# Patient Record
Sex: Female | Born: 1990
Health system: Southern US, Community
[De-identification: ages and names within clinical notes are randomized; demographics above are authoritative.]

## PROBLEM LIST (undated history)

## (undated) DIAGNOSIS — T7840XA Allergy, unspecified, initial encounter: Secondary | ICD-10-CM

## (undated) DIAGNOSIS — N12 Tubulo-interstitial nephritis, not specified as acute or chronic: Secondary | ICD-10-CM

## (undated) DIAGNOSIS — O99345 Other mental disorders complicating the puerperium: Secondary | ICD-10-CM

## (undated) DIAGNOSIS — F53 Postpartum depression: Secondary | ICD-10-CM

## (undated) HISTORY — DX: Allergy, unspecified, initial encounter: T78.40XA

## (undated) HISTORY — PX: OTHER SURGICAL HISTORY: SHX169

## (undated) HISTORY — PX: WISDOM TOOTH EXTRACTION: SHX21

## (undated) HISTORY — PX: APPENDECTOMY: SHX54

## (undated) HISTORY — DX: Other mental disorders complicating the puerperium: O99.345

## (undated) HISTORY — DX: Postpartum depression: F53.0

## (undated) HISTORY — PX: FRACTURE SURGERY: SHX138

## (undated) HISTORY — DX: Tubulo-interstitial nephritis, not specified as acute or chronic: N12

---

## 2014-12-07 ENCOUNTER — Encounter: Payer: Self-pay | Admitting: Pediatrics

## 2014-12-07 ENCOUNTER — Ambulatory Visit (INDEPENDENT_AMBULATORY_CARE_PROVIDER_SITE_OTHER): Payer: Medicaid Other | Admitting: Pediatrics

## 2014-12-07 VITALS — BP 124/70 | HR 67 | Temp 97.6°F | Ht 66.0 in | Wt 232.8 lb

## 2014-12-07 DIAGNOSIS — Z6837 Body mass index (BMI) 37.0-37.9, adult: Secondary | ICD-10-CM | POA: Diagnosis not present

## 2014-12-07 DIAGNOSIS — Z6833 Body mass index (BMI) 33.0-33.9, adult: Secondary | ICD-10-CM | POA: Insufficient documentation

## 2014-12-07 DIAGNOSIS — M25532 Pain in left wrist: Secondary | ICD-10-CM | POA: Diagnosis not present

## 2014-12-07 DIAGNOSIS — Z23 Encounter for immunization: Secondary | ICD-10-CM

## 2014-12-07 NOTE — Progress Notes (Signed)
    Subjective:    Patient ID: Misty Wright, female    DOB: Dec 13, 1990, 24 y.o.   MRN: [id number]  CC: wrist pain  HPI: Misty Wright is a 24 y.o. female presenting on 12/07/2014 for New Patient (Initial Visit) and Wrist Pain  Grip and twist and grip and lift painful, L wrist Pt R handed Crossfit started recently, beginning of the month did 6 classes, some burpees during classes, does not remember ever hurting wrist during class, would twinge sometimes No injury to wrist, started hurting within last three days Generally fairly healthy  IUD--has a copper one, placed  One and 26 yo girls at home, [place] In school online [employer] Last pap smear is 2015. No STIs Lives with husband, girls  Relevant past medical, surgical, family and social history reviewed and updated as indicated. Interim medical history since our last visit reviewed. Allergies and medications reviewed and updated.  ROS: All systems negative other than what is in HPI  Past Medical History Patient Active Problem List   Diagnosis Date Noted  . BMI 37.0-37.9, adult 12/07/2014    No current outpatient prescriptions on file.   No current facility-administered medications for this visit.       Objective:    BP 124/70 mmHg  Pulse 67  Temp(Src) 97.6 F (36.4 C) (Oral)  Ht 5\' 6"  (1.676 m)  Wt 232 lb 12.8 oz (105.597 kg)  BMI 37.59 kg/m2  LMP 12/03/2014  Wt Readings from Last 3 Encounters:  12/07/14 232 lb 12.8 oz (105.597 kg)    Gen: NAD, alert, cooperative with exam, NCAT EYES: EOMI, no scleral injection or icterus ENT:  TMs pearly gray b/l, OP without erythema LYMPH: no cervical LAD CV: NRRR, normal S1/S2, no murmur, distal pulses 2+ b/l Resp: CTABL, no wheezes, normal WOB Abd: +BS, soft, NTND. no guarding or organomegaly Ext: No edema, warm Neuro: Alert and oriented, strength equal b/l UE and LE, coordination grossly normal MSK: Wrist: Inspection normal with no visible erythema or  swelling. ROM smooth and slightly decreased extension and ulnar/radial deviation compared with opposite wrist.  Positive Finkelstein      Assessment & Plan:    Misty Wright was seen today for new patient (initial visit) and wrist pain.  Diagnoses and all orders for this visit:  BMI 37.0-37.9, adult Gained 90 lbs with birth of first child, has not been able to lose it since. Has been emotional problem Already following [employer], trying to keep healthy snacks around. Not drinking sugary drinks. Does sometimes eat when stressed. Goals: Walk or other activity 5 days a week Take a break when getting stressed, go for walk, go play outside with kids  Left wrist pain No known injury, slightly swollen, likely De Quevrains tenosynovitis given positive Finkelstein's, pain with tu Rest, NSAIDs Wrist splint if helps Let me know if not improving   Follow up plan: Return in about 3 months (around 03/09/2015).  Assunta Found, MD Misty Wright 12/07/2014, 9:15 AM

## 2014-12-07 NOTE — Patient Instructions (Addendum)
Walk or other activity 5 days a week Take a break when getting stressed, go for walk, go play outside with kids Keep up the good work!!!!

## 2014-12-11 ENCOUNTER — Ambulatory Visit (INDEPENDENT_AMBULATORY_CARE_PROVIDER_SITE_OTHER): Payer: Medicaid Other

## 2014-12-11 ENCOUNTER — Other Ambulatory Visit: Payer: Medicaid Other

## 2014-12-11 ENCOUNTER — Telehealth: Payer: Self-pay | Admitting: Pediatrics

## 2014-12-11 DIAGNOSIS — M25531 Pain in right wrist: Secondary | ICD-10-CM | POA: Diagnosis not present

## 2014-12-11 NOTE — Telephone Encounter (Signed)
Ordered wrist film, she will be here later today to get that done.

## 2014-12-12 NOTE — Telephone Encounter (Signed)
Pt came for x-ray

## 2015-03-09 ENCOUNTER — Ambulatory Visit (INDEPENDENT_AMBULATORY_CARE_PROVIDER_SITE_OTHER): Payer: Medicaid Other | Admitting: Pediatrics

## 2015-03-09 ENCOUNTER — Encounter: Payer: Self-pay | Admitting: Pediatrics

## 2015-03-09 VITALS — BP 107/69 | HR 76 | Temp 97.9°F | Ht 66.0 in | Wt 206.2 lb

## 2015-03-09 DIAGNOSIS — Z6833 Body mass index (BMI) 33.0-33.9, adult: Secondary | ICD-10-CM

## 2015-03-09 NOTE — Progress Notes (Signed)
Subjective:    Patient ID: Misty Wright, female    DOB: 11/05/1990, 24 y.o.   MRN: [id number]  CC: Follow-up BMI and wrist pain  HPI: Misty Wright is a 24 y.o. female presenting for Follow-up  Here for f/u wrist pain and weight Stopped breast feeding, was gaining weight regularly while breastfeeding she thinks Working out at home, walking some [employer]  Lots of fruits and vegetables Still in school, less stressful semester Overall doing well Wrist no longer hurting  When in good shape weight was 155 lb   Depression screen PHQ 2/9 03/09/2015 12/07/2014  Decreased Interest 1 0  Down, Depressed, Hopeless 1 1  PHQ - 2 Score 2 1  Altered sleeping 0 -  Tired, decreased energy 1 -  Change in appetite 1 -  Feeling bad or failure about yourself  1 -  Trouble concentrating 1 -  Moving slowly or fidgety/restless 0 -  Suicidal thoughts 0 -  PHQ-9 Score 6 -  Difficult doing work/chores Somewhat difficult -     Relevant past medical, surgical, family and social history reviewed and updated as indicated. Interim medical history since our last visit reviewed. Allergies and medications reviewed and updated.    ROS: Per HPI unless specifically indicated above  History  Smoking status  . Former Smoker  . Types: Cigarettes  . Quit date: 12/24/2012  Smokeless tobacco  . Not on file    Past Medical History Patient Active Problem List   Diagnosis Date Noted  . BMI 33.0-33.9,adult 12/07/2014    No current outpatient prescriptions on file.   No current facility-administered medications for this visit.       Objective:    BP 107/69 mmHg  Pulse 76  Temp(Src) 97.9 F (36.6 C) (Oral)  Ht 5' 6" (1.676 m)  Wt 206 lb 3.2 oz (93.532 kg)  BMI 33.30 kg/m2  Wt Readings from Last 3 Encounters:  03/09/15 206 lb 3.2 oz (93.532 kg)  12/07/14 232 lb 12.8 oz (105.597 kg)     Gen: NAD, alert, cooperative with exam, NCAT EYES: EOMI, no scleral injection or  icterus ENT:  MMM LYMPH: no cervical LAD CV: NRRR, normal S1/S2, no murmur, distal pulses 2+ b/l Resp: CTABL, no wheezes, normal WOB Ext: No edema, warm Neuro: Alert and oriented, strength equal b/l UE and LE, coordination grossly normal MSK: normal muscle bulk, normal ROM of wrists b/l, no point tenderness     Assessment & Plan:    Misty Wright was seen today for follow-up BMI. Has lost 30 lbs over past three months with increase in exercise, decrease in calorie intake. Is pleased with her progress. Does not feel deprived of food, is eating three meals every day. Will check BMP today, f/u in 6 months. Continue lifestyle changes.  Diagnoses and all orders for this visit:  BMI 33.0-33.9,adult -     BMP8+EGFR    Follow up plan: Return in about 6 months (around 09/06/2015).   , MD [facility] 03/09/2015, 8:34 AM   

## 2015-03-09 NOTE — Patient Instructions (Signed)
Keep up the good work

## 2015-03-22 ENCOUNTER — Encounter: Payer: Self-pay | Admitting: Pediatrics

## 2015-03-22 ENCOUNTER — Ambulatory Visit (INDEPENDENT_AMBULATORY_CARE_PROVIDER_SITE_OTHER): Payer: Medicaid Other | Admitting: Pediatrics

## 2015-03-22 VITALS — BP 102/66 | HR 84 | Temp 97.2°F | Ht 66.0 in | Wt 201.8 lb

## 2015-03-22 DIAGNOSIS — L600 Ingrowing nail: Secondary | ICD-10-CM

## 2015-03-22 MED ORDER — CEPHALEXIN 500 MG PO CAPS: 500.0000 mg | ORAL_CAPSULE | Freq: Two times a day (BID) | ORAL | Status: DC

## 2015-03-22 NOTE — Progress Notes (Signed)
Subjective:    Patient ID: Misty Wright, female    DOB: 1990/04/21, 25 y.o.   MRN: [id number]  CC: Toe Pain   HPI: Misty Wright is a 25 y.o. female presenting for Toe Pain  Has had problems with R great toenail starting about a year ago, had broken nail short, nail had grown out and pain was better but then broke nail short again recently Pt has had ingrown toenails on L great toe removed both medially and laterally without return of pain and would like same done on R foot.  No fevers, some redness, pain in toe anytime something hits it. No discharge.  Depression screen [facility] 2/9 03/22/2015 03/09/2015 12/07/2014  Decreased Interest 0 1 0  Down, Depressed, Hopeless 0 1 1  PHQ - 2 Score 0 2 1  Altered sleeping - 0 -  Tired, decreased energy - 1 -  Change in appetite - 1 -  Feeling bad or failure about yourself  - 1 -  Trouble concentrating - 1 -  Moving slowly or fidgety/restless - 0 -  Suicidal thoughts - 0 -  PHQ-9 Score - 6 -  Difficult doing work/chores - Somewhat difficult -     Relevant past medical, surgical, family and social history reviewed and updated as indicated. Interim medical history since our last visit reviewed. Allergies and medications reviewed and updated.    ROS: Per HPI unless specifically indicated above  History  Smoking status  . Former Smoker  . Types: Cigarettes  . Quit date: 12/24/2012  Smokeless tobacco  . Not on file    Past Medical History Patient Active Problem List   Diagnosis Date Noted  . BMI 33.0-33.9,adult 12/07/2014    Current Outpatient Prescriptions  Medication Sig Dispense Refill  . cephALEXin (KEFLEX) 500 MG capsule Take 1 capsule (500 mg total) by mouth 2 (two) times daily. 14 capsule 0   No current facility-administered medications for this visit.       Objective:    BP 102/66 mmHg  Pulse 84  Temp(Src) 97.2 F (36.2 C) (Oral)  Ht 5\' 6"  (1.676 m)  Wt 201 lb 12.8 oz (91.536 kg)  BMI 32.59 kg/m2  Wt  Readings from Last 3 Encounters:  03/22/15 201 lb 12.8 oz (91.536 kg)  03/09/15 206 lb 3.2 oz (93.532 kg)  12/07/14 232 lb 12.8 oz (105.597 kg)     Gen: NAD, alert, cooperative with exam, NCAT EYES: EOMI, no scleral injection or icterus CV: distal pulses 2+ b/l, WWP Resp:  normal WOB Ext: No edema, warm Neuro: Alert and oriented Skin: R great toe with nail broken off to apprx halfway back toward nail bed at an angle, full nail present Lateral side of nail. Some redness, some tenderness with palpation along nail bed.      Assessment & Plan:    Diann was seen today for toe pain.  Diagnoses and all orders for this visit:  Ingrown right big toenail Discussed options including waiting for nail to grow out, trial of abx, and removal of nail, Can let nail regrow to see if problem resolves vs ablating nail. Pt would like medial side of nail where she has been having pain removed with nail bed ablated. See separate procedure note.  Other orders -     cephALEXin (KEFLEX) 500 MG capsule; Take 1 capsule (500 mg total) by mouth 2 (two) times daily.   PROCEDURE NOTE:  Nail surgery. Treatment options and alternatives discussed. Recommended permanent phenol matrixectomy  and patient agreed. Right hallux was prepped with betadine scrub and a toe block of 6cc of 1% lidocaine plain was administered in a digital toe block. The toe was then prepped with betadine solution . The offending nail border was then excised and matrix tissue exposed. Phenol was then applied to the matrix tissue followed by an betadine wash. Antibiotic ointment and a dry sterile dressing was applied. The patient was dispensed instructions for aftercare. Home instructions given.Rx for cephalexin for 7 days given to prevent infection given redness in area prior to procedure. Pt tolerated procedure well.     Follow up plan: prn  Assunta Found, MD [facility] 03/22/2015, 1:05 PM

## 2015-04-03 ENCOUNTER — Telehealth: Payer: Self-pay | Admitting: Pediatrics

## 2015-04-03 MED ORDER — FLUCONAZOLE 150 MG PO TABS: ORAL_TABLET | ORAL | Status: DC

## 2015-04-03 NOTE — Telephone Encounter (Signed)
Stp and she states she has vaginal itching and white thick discharge after taking the antibiotic. Diflucan 150 #2 take 1 now and repeat in 3 days sent over to pharmacy.

## 2015-07-02 ENCOUNTER — Telehealth: Payer: Self-pay | Admitting: Pediatrics

## 2015-07-02 NOTE — Telephone Encounter (Signed)
Spoke with pt and she had questions regarding her Paragard IUD. Pt has had some irregular menses and spotting in between menses. Advised pt to take urine pregnancy test and feel for strings, if she can feel the strings and the urine pregnancy test are negative to monitor for a few months and make an appt for evaluation if it continues. If the urine pregnancy test is positive or she can't feel the strings advised to CB and schedule appt. Pt voiced understanding.

## 2015-07-19 ENCOUNTER — Ambulatory Visit (INDEPENDENT_AMBULATORY_CARE_PROVIDER_SITE_OTHER): Payer: Medicaid Other | Admitting: Family Medicine

## 2015-07-19 ENCOUNTER — Encounter: Payer: Self-pay | Admitting: Family Medicine

## 2015-07-19 VITALS — BP 114/74 | HR 81 | Temp 97.4°F | Ht 66.0 in | Wt 202.0 lb

## 2015-07-19 DIAGNOSIS — Z029 Encounter for administrative examinations, unspecified: Secondary | ICD-10-CM | POA: Diagnosis not present

## 2015-07-19 NOTE — Progress Notes (Signed)
   HPI  Patient presents today here to have school forms filled out.  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  She has shot records present with her, this is a state shot card which is filled out in detail. After reviewing her paperwork she does not need a TB test and she is not lacking any immunizations.   Her paperwork was signed agreeing that all was accurate.    Laroy Apple, MD [facility] 07/19/2015, 2:42 PM

## 2015-08-14 ENCOUNTER — Telehealth: Payer: Self-pay

## 2015-08-14 DIAGNOSIS — L600 Ingrowing nail: Secondary | ICD-10-CM | POA: Insufficient documentation

## 2015-08-14 NOTE — Telephone Encounter (Signed)
Wants to be referred to Podiatrist that takes MCD for ingrown toenail

## 2015-08-14 NOTE — Telephone Encounter (Signed)
That is fine, ok with referral.   We can take care of it also, the wait may be longer than she prefers for podiatry.   Laroy Apple, MD [facility] 08/14/2015, 11:25 AM

## 2015-08-31 ENCOUNTER — Encounter: Payer: Self-pay | Admitting: Podiatry

## 2015-08-31 ENCOUNTER — Ambulatory Visit (INDEPENDENT_AMBULATORY_CARE_PROVIDER_SITE_OTHER): Payer: Medicaid Other | Admitting: Podiatry

## 2015-08-31 VITALS — BP 104/62 | HR 65 | Resp 16 | Ht 66.0 in | Wt 178.0 lb

## 2015-08-31 DIAGNOSIS — L600 Ingrowing nail: Secondary | ICD-10-CM | POA: Diagnosis not present

## 2015-08-31 NOTE — Patient Instructions (Signed)

## 2015-08-31 NOTE — Progress Notes (Signed)
   Subjective:    Patient ID: Misty Wright, female    DOB: 08/25/1990, 25 y.o.   MRN: [id number]  HPI Chief Complaint  Patient presents with  . Nail Problem    Right foot; great toe-medial; pt stated, "Saw pus coming out of toe last week; used epsom salt and neosporin with relief"    Pt was treated by her Primary Care Doctor 6 months ago for her ingrown toenail on her right foot, great toe.    Review of Systems  Constitutional: Positive for unexpected weight change.  All other systems reviewed and are negative.      Objective:   Physical Exam        Assessment & Plan:

## 2015-09-02 NOTE — Progress Notes (Signed)
Subjective:     Patient ID: Misty Wright, female   DOB: October 30, 1990, 25 y.o.   MRN: [id number]  HPI patient presents stating she's had an ingrown toenail the right big toe and she had 1 on the left one that was fixed and she wants this fixed permanently   Review of Systems  All other systems reviewed and are negative.      Objective:   Physical Exam  Constitutional: She is oriented to person, place, and time.  Cardiovascular: Intact distal pulses.   Musculoskeletal: Normal range of motion.  Neurological: She is oriented to person, place, and time.  Skin: Skin is warm.  Nursing note and vitals reviewed.  neurovascular status found to be intact muscle strength adequate range of motion within normal limits with patient noted to have an incurvated medial border right hallux that's painful when pressed and makes shoe gear difficult with distal redness but no drainage. Patient is noted to have incurvation that's painful when palpated     Assessment:     Ingrown toenail deformity right hallux medial border    Plan:     Instructed on soaks and I went ahead today infiltrated the right hallux 60 mg I can Marcaine mixture first discussing risk of procedure and then remove the medial border exposing matrix and applying phenol 3 applications 30 seconds followed by alcohol lavage and sterile dressing

## 2015-09-06 ENCOUNTER — Ambulatory Visit: Payer: Medicaid Other | Admitting: Pediatrics

## 2015-09-06 ENCOUNTER — Ambulatory Visit: Payer: Medicaid Other | Admitting: Podiatry

## 2015-09-25 ENCOUNTER — Telehealth: Payer: Self-pay | Admitting: *Deleted

## 2015-09-25 NOTE — Telephone Encounter (Signed)
Called patient at [phone] (Home #) to check to see how they were doing from their ingrown toenail procedure that was performed on Friday, August 31, 2015. Pt stated, "Everything seems to be doing well and has no pain".

## 2016-03-07 ENCOUNTER — Other Ambulatory Visit: Payer: Medicaid Other | Admitting: Pediatrics

## 2016-09-01 ENCOUNTER — Encounter: Payer: Self-pay | Admitting: Family Medicine

## 2016-09-01 ENCOUNTER — Other Ambulatory Visit (HOSPITAL_COMMUNITY)
Admission: RE | Admit: 2016-09-01 | Discharge: 2016-09-01 | Disposition: A | Discharge status: Home / Self Care | Payer: BLUE CROSS/BLUE SHIELD | Source: Ambulatory Visit | Attending: Family Medicine | Admitting: Family Medicine

## 2016-09-01 ENCOUNTER — Ambulatory Visit (INDEPENDENT_AMBULATORY_CARE_PROVIDER_SITE_OTHER): Payer: BLUE CROSS/BLUE SHIELD | Admitting: Family Medicine

## 2016-09-01 VITALS — BP 110/72 | HR 72 | Ht 66.0 in | Wt 184.6 lb

## 2016-09-01 DIAGNOSIS — Z124 Encounter for screening for malignant neoplasm of cervix: Secondary | ICD-10-CM

## 2016-09-01 DIAGNOSIS — Z0000 Encounter for general adult medical examination without abnormal findings: Secondary | ICD-10-CM | POA: Diagnosis not present

## 2016-09-01 DIAGNOSIS — Z01419 Encounter for gynecological examination (general) (routine) without abnormal findings: Secondary | ICD-10-CM | POA: Diagnosis not present

## 2016-09-01 LAB — CBC WITH DIFFERENTIAL/PLATELET
Basophils Absolute: 0 cells/uL (ref 0–200)
Basophils Relative: 0 %
EOS PCT: 1 %
Eosinophils Absolute: 68 cells/uL (ref 15–500)
HCT: 42 % (ref 35.0–45.0)
HEMOGLOBIN: 14 g/dL (ref 11.7–15.5)
LYMPHS ABS: 1904 {cells}/uL (ref 850–3900)
Lymphocytes Relative: 28 %
MCH: 29.2 pg (ref 27.0–33.0)
MCHC: 33.3 g/dL (ref 32.0–36.0)
MCV: 87.7 fL (ref 80.0–100.0)
MPV: 9.2 fL (ref 7.5–12.5)
Monocytes Absolute: 340 cells/uL (ref 200–950)
Monocytes Relative: 5 %
NEUTROS PCT: 66 %
Neutro Abs: 4488 cells/uL (ref 1500–7800)
Platelets: 256 10*3/uL (ref 140–400)
RBC: 4.79 MIL/uL (ref 3.80–5.10)
RDW: 13.3 % (ref 11.0–15.0)
WBC: 6.8 10*3/uL (ref 4.0–10.5)

## 2016-09-01 LAB — POCT URINALYSIS DIP (PROADVANTAGE DEVICE)
BILIRUBIN UA: NEGATIVE
BILIRUBIN UA: NEGATIVE mg/dL
Blood, UA: NEGATIVE
Glucose, UA: NEGATIVE mg/dL
LEUKOCYTES UA: NEGATIVE
Nitrite, UA: NEGATIVE
Protein Ur, POC: NEGATIVE mg/dL
Specific Gravity, Urine: 1.02
Urobilinogen, Ur: NEGATIVE
pH, UA: 6 (ref 5.0–8.0)

## 2016-09-01 LAB — COMPREHENSIVE METABOLIC PANEL
ALBUMIN: 4.5 g/dL (ref 3.6–5.1)
ALT: 14 U/L (ref 6–29)
AST: 15 U/L (ref 10–30)
Alkaline Phosphatase: 44 U/L (ref 33–115)
BILIRUBIN TOTAL: 0.5 mg/dL (ref 0.2–1.2)
BUN: 10 mg/dL (ref 7–25)
CHLORIDE: 103 mmol/L (ref 98–110)
CO2: 24 mmol/L (ref 20–31)
Calcium: 9.1 mg/dL (ref 8.6–10.2)
Creat: 0.69 mg/dL (ref 0.50–1.10)
GLUCOSE: 83 mg/dL (ref 65–99)
Potassium: 4.1 mmol/L (ref 3.5–5.3)
Sodium: 139 mmol/L (ref 135–146)
Total Protein: 7.2 g/dL (ref 6.1–8.1)

## 2016-09-01 NOTE — Patient Instructions (Signed)

## 2016-09-01 NOTE — Progress Notes (Signed)
Subjective:    Patient ID: Misty Wright, female    DOB: 22-Sep-1990, 26 y.o.   MRN: [id number]  HPI Chief Complaint  Patient presents with  . new pt    new pt, no other concens. non fasting. due for pap smear   She is new to the practice and here for a complete physical exam. Previous medical care: [facility].  Last CPE: 1 year ago.   Other providers: none currently.   Past medical history: postpartum depression and took medication for 4-6 months after the birth of her first child. No meds since 2013. Had counseling then but nothing since. Is not interested in counseling now.   Social history: Lives with 2 daughters age 54 and 6 and husband.  works as Agricultural engineer. Just graduated from [employer] with masters degree.  Smoked 2 cigarettes per day for 4 years and quit in 2013. drinks alcohol 1-2 per week, denies drug use  Diet: fairly healthy. Likes sweets.  Excerise: 3 times per week.   Immunizations: Tdap- 3 years ago.   Health maintenance:  Mammogram: N/A Colonoscopy: N/A Last pap smear: 20015 Last Menstrual cycle: 08/05/2016 Contraception: IUD  Pregnancies: 3 Last Dental Exam: 1 year ago.  Last Eye Exam: years ago.   Depression screen [facility] 2/9 09/01/2016 07/19/2015 03/22/2015 03/09/2015 12/07/2014  Decreased Interest 0 0 0 1 0  Down, Depressed, Hopeless 0 0 0 1 1  PHQ - 2 Score 0 0 0 2 1  Altered sleeping - - - 0 -  Tired, decreased energy - - - 1 -  Change in appetite - - - 1 -  Feeling bad or failure about yourself  - - - 1 -  Trouble concentrating - - - 1 -  Moving slowly or fidgety/restless - - - 0 -  Suicidal thoughts - - - 0 -  PHQ-9 Score - - - 6 -  Difficult doing work/chores - - - Somewhat difficult -     Wears seatbelt always, uses sunscreen, smoke detectors in home and functioning, does not text while driving and feels safe in home environment.   Reviewed allergies, medications, past medical, surgical, family, and social  history.   Review of Systems Review of Systems Constitutional: -fever, -chills, -sweats, -unexpected weight change,-fatigue ENT: -runny nose, -ear pain, -sore throat Cardiology:  -chest pain, -palpitations, -edema Respiratory: -cough, -shortness of breath, -wheezing Gastroenterology: -abdominal pain, -nausea, -vomiting, -diarrhea, -constipation  Hematology: -bleeding or bruising problems Musculoskeletal: -arthralgias, -myalgias, -joint swelling, -back pain Ophthalmology: -vision changes Urology: -dysuria, -difficulty urinating, -hematuria, -urinary frequency, -urgency Neurology: -headache, -weakness, -tingling, -numbness       Objective:   Physical Exam BP 110/72   Pulse 72   Ht 5\' 6"  (1.676 m)   Wt 184 lb 9.6 oz (83.7 kg)   LMP 08/05/2016   BMI 29.80 kg/m   General Appearance:    Alert, cooperative, no distress, appears stated age  Head:    Normocephalic, without obvious abnormality, atraumatic  Eyes:    PERRL, conjunctiva/corneas clear, EOM's intact, fundi    benign  Ears:    Normal TM's and external ear canals  Nose:   Nares normal, mucosa normal, no drainage or sinus   tenderness  Throat:   Lips, mucosa, and tongue normal; teeth and gums normal  Neck:   Supple, no lymphadenopathy;  thyroid:  no   enlargement/tenderness/nodules; no carotid   bruit or JVD  Back:    Spine nontender, no curvature, ROM normal, no  CVA     tenderness  Lungs:     Clear to auscultation bilaterally without wheezes, rales or     ronchi; respirations unlabored  Chest Wall:    No tenderness or deformity   Heart:    Regular rate and rhythm, S1 and S2 normal, no murmur, rub   or gallop  Breast Exam:    No tenderness, masses, or nipple discharge or inversion.      No axillary lymphadenopathy  Abdomen:     Soft, non-tender, nondistended, normoactive bowel sounds,    no masses, no hepatosplenomegaly  Genitalia:    Normal external genitalia without lesions.  BUS and vagina normal; cervix without  lesions, or cervical motion tenderness. No abnormal vaginal discharge.  Uterus and adnexa not enlarged, nontender, no masses.  Pap performed and IUD strings visualized. Chaperone present.   Rectal:    Not performed due to age<40 and no related complaints  Extremities:   No clubbing, cyanosis or edema  Pulses:   2+ and symmetric all extremities  Skin:   Skin color, texture, turgor normal, no rashes or lesions  Lymph nodes:   Cervical, supraclavicular, and axillary nodes normal  Neurologic:   CNII-XII intact, normal strength, sensation and gait; reflexes 2+ and symmetric throughout          Psych:   Normal mood, affect, hygiene and grooming.     Urinalysis dipstick: neg       Assessment & Plan:  Routine general medical examination at a health care facility - Plan: CBC with Differential/Platelet, Comprehensive metabolic panel, POCT Urinalysis DIP (Proadvantage Device)  Screening for cervical cancer - Plan: Cytology - PAP  She is doing well overall physically and emotionally. No depression currently.  Immunization history at [place], will get these records. She appears to be up to date.  Pap smear done- chaperone present.  Counseled on safety and health promotion.  Declines STD testing.  Will get records from her previous PCP.  Follow up pending labs.

## 2016-09-03 LAB — CYTOLOGY - PAP: DIAGNOSIS: NEGATIVE

## 2016-09-04 ENCOUNTER — Encounter: Payer: Self-pay | Admitting: Internal Medicine

## 2016-09-04 ENCOUNTER — Telehealth: Payer: Self-pay

## 2016-09-04 NOTE — Telephone Encounter (Signed)
Shot records from [employer] placed in your folder for review. Misty Wright

## 2016-11-25 DIAGNOSIS — F418 Other specified anxiety disorders: Secondary | ICD-10-CM | POA: Diagnosis not present

## 2016-11-25 DIAGNOSIS — F331 Major depressive disorder, recurrent, moderate: Secondary | ICD-10-CM | POA: Diagnosis not present

## 2016-12-03 DIAGNOSIS — F418 Other specified anxiety disorders: Secondary | ICD-10-CM | POA: Diagnosis not present

## 2016-12-11 DIAGNOSIS — F418 Other specified anxiety disorders: Secondary | ICD-10-CM | POA: Diagnosis not present

## 2016-12-13 DIAGNOSIS — Z23 Encounter for immunization: Secondary | ICD-10-CM | POA: Diagnosis not present

## 2017-10-01 ENCOUNTER — Ambulatory Visit: Payer: BLUE CROSS/BLUE SHIELD | Admitting: Family Medicine

## 2017-10-01 ENCOUNTER — Encounter: Payer: Self-pay | Admitting: Family Medicine

## 2017-10-01 VITALS — BP 122/80 | HR 70 | Ht 66.0 in | Wt 179.2 lb

## 2017-10-01 DIAGNOSIS — Z0000 Encounter for general adult medical examination without abnormal findings: Secondary | ICD-10-CM

## 2017-10-01 NOTE — Patient Instructions (Signed)

## 2017-10-01 NOTE — Addendum Note (Signed)
Addended by: Minette Headland A on: 10/01/2017 03:53 PM   Modules accepted: Orders

## 2017-10-01 NOTE — Progress Notes (Signed)
Subjective:    Patient ID: Misty Wright, female    DOB: 08/28/1990, 27 y.o.   MRN: [id number]  HPI Chief Complaint  Patient presents with  . cpe    cpe, right big toe callous. no other concerns   She is here for a complete physical exam. Previous medical care: Last CPE: 09/01/2016   Has seen a counselor in the past year for depressed mood. States she is no longer seeing someone due to cost. Mood is ok, fluctuates and has good days and bad days.   Has a callus on her right great toe. Not bothersome. Soaks this and shaves it down occasionally.   Social history: Lives with her husband who works in Insurance underwriter and her 2 daughters, works as Agricultural engineer Denies smoking, drinking alcohol-1 glass of wine most nights , denies drug use  Diet: healthy  Excerise: usually. Training for bike ride  Caffeine: 1 - 2 cups per day   Immunizations: UTD  Health maintenance:  Mammogram: N/A Colonoscopy: N/A Last Gynecological Exam: last year  Last Menstrual cycle: last week and regular  IUD in place  Last Dental Exam: last month  Last Eye Exam: years ago   Depression screen [facility] 2/9 10/01/2017 09/01/2016 07/19/2015 03/22/2015 03/09/2015  Decreased Interest 0 0 0 0 1  Down, Depressed, Hopeless 0 0 0 0 1  PHQ - 2 Score 0 0 0 0 2  Altered sleeping - - - - 0  Tired, decreased energy - - - - 1  Change in appetite - - - - 1  Feeling bad or failure about yourself  - - - - 1  Trouble concentrating - - - - 1  Moving slowly or fidgety/restless - - - - 0  Suicidal thoughts - - - - 0  PHQ-9 Score - - - - 6  Difficult doing work/chores - - - - Somewhat difficult     Wears seatbelt always, uses sunscreen, smoke detectors in home and functioning, does not text while driving and feels safe in home environment.   Reviewed allergies, medications, past medical, surgical, family, and social history.    Review of Systems Review of Systems Constitutional: -fever, -chills, -sweats, -unexpected weight  change,-fatigue ENT: -runny nose, -ear pain, -sore throat Cardiology:  -chest pain, -palpitations, -edema Respiratory: -cough, -shortness of breath, -wheezing Gastroenterology: -abdominal pain, -nausea, -vomiting, -diarrhea, -constipation  Hematology: -bleeding or bruising problems Musculoskeletal: -arthralgias, -myalgias, -joint swelling, -back pain Ophthalmology: -vision changes Urology: -dysuria, -difficulty urinating, -hematuria, -urinary frequency, -urgency Neurology: -headache, -weakness, -tingling, -numbness       Objective:   Physical Exam BP 122/80   Pulse 70   Ht 5\' 6"  (1.676 m)   Wt 179 lb 3.2 oz (81.3 kg)   LMP 09/24/2017   BMI 28.92 kg/m   General Appearance:    Alert, cooperative, no distress, appears stated age  Head:    Normocephalic, without obvious abnormality, atraumatic  Eyes:    PERRL, conjunctiva/corneas clear, EOM's intact, fundi    benign  Ears:    Normal TM's and external ear canals  Nose:   Nares normal, mucosa normal, no drainage or sinus   tenderness  Throat:   Lips, mucosa, and tongue normal; teeth and gums normal  Neck:   Supple, no lymphadenopathy;  thyroid:  no   enlargement/tenderness/nodules; no carotid   bruit or JVD  Back:    Spine nontender, no curvature, ROM normal, no CVA     tenderness  Lungs:  Clear to auscultation bilaterally without wheezes, rales or     ronchi; respirations unlabored  Chest Wall:    No tenderness or deformity   Heart:    Regular rate and rhythm, S1 and S2 normal, no murmur, rub   or gallop  Breast Exam:    Declines.   Abdomen:     Soft, non-tender, nondistended, normoactive bowel sounds,    no masses, no hepatosplenomegaly  Genitalia:    Declines. Pap smear.   Rectal:    Not performed due to age<40 and no related complaints  Extremities:   No clubbing, cyanosis or edema  Pulses:   2+ and symmetric all extremities  Skin:   Skin color, texture, turgor normal, no rashes or lesions  Lymph nodes:   Cervical,  supraclavicular, and axillary nodes normal  Neurologic:   CNII-XII intact, normal strength, sensation and gait; reflexes 2+ and symmetric throughout          Psych:   Normal mood, affect, hygiene and grooming.    Urinalysis dipstick: trace blood       Assessment & Plan:  Routine general medical examination at a health care facility  She appears to be doing well. Up-to-date on immunizations and health maintenance. Discussed healthy diet and getting at least 150 minutes of physical activity per week.  She will be riding in a bike race in a couple of months and will be training for this. Discussed safety and health promotion. She will let me know if the callus on her great toe gets worse or if she decides to see a podiatrist I will be happy to refer her for this.  For now this is not causing her any difficulty. Declines labs today. Follow-up in 1 year or sooner if needed.

## 2017-12-11 ENCOUNTER — Other Ambulatory Visit (INDEPENDENT_AMBULATORY_CARE_PROVIDER_SITE_OTHER): Payer: BLUE CROSS/BLUE SHIELD

## 2017-12-11 DIAGNOSIS — Z23 Encounter for immunization: Secondary | ICD-10-CM

## 2018-09-14 ENCOUNTER — Telehealth: Payer: Self-pay | Admitting: Internal Medicine

## 2018-09-14 NOTE — Telephone Encounter (Signed)
Tried to call pt as she is overdue for appt but number is not a working number

## 2018-10-04 ENCOUNTER — Encounter: Payer: BLUE CROSS/BLUE SHIELD | Admitting: Family Medicine

## 2018-10-13 DIAGNOSIS — Z01411 Encounter for gynecological examination (general) (routine) with abnormal findings: Secondary | ICD-10-CM | POA: Diagnosis not present

## 2018-10-13 DIAGNOSIS — Z01419 Encounter for gynecological examination (general) (routine) without abnormal findings: Secondary | ICD-10-CM | POA: Diagnosis not present

## 2018-10-13 DIAGNOSIS — Z1151 Encounter for screening for human papillomavirus (HPV): Secondary | ICD-10-CM | POA: Diagnosis not present

## 2018-10-13 DIAGNOSIS — R87610 Atypical squamous cells of undetermined significance on cytologic smear of cervix (ASC-US): Secondary | ICD-10-CM | POA: Diagnosis not present

## 2018-10-13 LAB — HM PAP SMEAR

## 2019-05-12 NOTE — Patient Instructions (Addendum)
Dermatology offices  [facility]: Phone #: [phone] Address: [address] [place], [place] [address]  [facility]: Phone: [phone]  Address: [address] [place], [place] [address]  [facility] Address: [address] Barbara Cower [place], [place] [address] Phone: [phone]    Preventive Care 28-31 Years Old, Female Preventive care refers to visits with your health care provider and lifestyle choices that can promote health and wellness. This includes:  A yearly physical exam. This may also be called an annual well check.  Regular dental visits and eye exams.  Immunizations.  Screening for certain conditions.  Healthy lifestyle choices, such as eating a healthy diet, getting regular exercise, not using drugs or products that contain nicotine and tobacco, and limiting alcohol use. What can I expect for my preventive care visit? Physical exam Your health care provider will check your:  Height and weight. This may be used to calculate body mass index (BMI), which tells if you are at a healthy weight.  Heart rate and blood pressure.  Skin for abnormal spots. Counseling Your health care provider may ask you questions about your:  Alcohol, tobacco, and drug use.  Emotional well-being.  Home and relationship well-being.  Sexual activity.  Eating habits.  Work and work Statistician.  Method of birth control.  Menstrual cycle.  Pregnancy history. What immunizations do I need?  Influenza (flu) vaccine  This is recommended every year. Tetanus, diphtheria, and pertussis (Tdap) vaccine  You may need a Td booster every 10 years. Varicella (chickenpox) vaccine  You may need this if you have not been vaccinated. Human papillomavirus (HPV) vaccine  If recommended by your health care provider, you may need three doses over 6 months. Measles, mumps, and rubella (MMR) vaccine  You may need at least one dose of MMR. You  may also need a second dose. Meningococcal conjugate (MenACWY) vaccine  One dose is recommended if you are age 77-21 years and a first-year college student living in a residence hall, or if you have one of several medical conditions. You may also need additional booster doses. Pneumococcal conjugate (PCV13) vaccine  You may need this if you have certain conditions and were not previously vaccinated. Pneumococcal polysaccharide (PPSV23) vaccine  You may need one or two doses if you smoke cigarettes or if you have certain conditions. Hepatitis A vaccine  You may need this if you have certain conditions or if you travel or work in places where you may be exposed to hepatitis A. Hepatitis B vaccine  You may need this if you have certain conditions or if you travel or work in places where you may be exposed to hepatitis B. Haemophilus influenzae type b (Hib) vaccine  You may need this if you have certain conditions. You may receive vaccines as individual doses or as more than one vaccine together in one shot (combination vaccines). Talk with your health care provider about the risks and benefits of combination vaccines. What tests do I need?  Blood tests  Lipid and cholesterol levels. These may be checked every 5 years starting at age 25.  Hepatitis C test.  Hepatitis B test. Screening  Diabetes screening. This is done by checking your blood sugar (glucose) after you have not eaten for a while (fasting).  Sexually transmitted disease (STD) testing.  BRCA-related cancer screening. This may be done if you have a family history of breast, ovarian, tubal, or peritoneal cancers.  Pelvic exam and Pap test. This may be done every 3  years starting at age 59. Starting at age 74, this may be done every 5 years if you have a Pap test in combination with an HPV test. Talk with your health care provider about your test results, treatment options, and if necessary, the need for more tests. Follow  these instructions at home: Eating and drinking   Eat a diet that includes fresh fruits and vegetables, whole grains, lean protein, and low-fat dairy.  Take vitamin and mineral supplements as recommended by your health care provider.  Do not drink alcohol if: ? Your health care provider tells you not to drink. ? You are pregnant, may be pregnant, or are planning to become pregnant.  If you drink alcohol: ? Limit how much you have to 0-1 drink a day. ? Be aware of how much alcohol is in your drink. In the [place] one drink equals one 12 oz bottle of beer (355 mL), one 5 oz glass of wine (148 mL), or one 1 oz glass of hard liquor (44 mL). Lifestyle  Take daily care of your teeth and gums.  Stay active. Exercise for at least 30 minutes on 5 or more days each week.  Do not use any products that contain nicotine or tobacco, such as cigarettes, e-cigarettes, and chewing tobacco. If you need help quitting, ask your health care provider.  If you are sexually active, practice safe sex. Use a condom or other form of birth control (contraception) in order to prevent pregnancy and STIs (sexually transmitted infections). If you plan to become pregnant, see your health care provider for a preconception visit. What's next?  Visit your health care provider once a year for a well check visit.  Ask your health care provider how often you should have your eyes and teeth checked.  Stay up to date on all vaccines. This information is not intended to replace advice given to you by your health care provider. Make sure you discuss any questions you have with your health care provider. Document Revised: 10/22/2017 Document Reviewed: 10/22/2017 Elsevier Patient Education  2020 [employer].

## 2019-05-12 NOTE — Progress Notes (Signed)
Subjective:    Patient ID: Misty Wright, female    DOB: 1990/05/25, 29 y.o.   MRN: [id number]  HPI Chief Complaint  Patient presents with  . cpe    nonfasting cpe, sess obgyn   She is here for a complete physical exam.  Last CPE: 10/01/2017  Other providers: OB/GYN- Halford Chessman  Podiatrist - Dr. Paulla Dolly  Therapist   She would like to have a couple of moles checked. States the one on her left side occasionally rubs her bra and bothers her. Others on her back but not bothersome.  States her MGM had skin cancer so she is aware of the need for sunscreen.   She continues having an issue with a corn/callus on the bottom of her great right toe. Using OTC treatments. Has not seen podiatry since 2017.   She has been eating healthier. Now doing a protein smoothie for lunch replacement.    Social history: Lives with her husband who works in Insurance underwriter and her 2 daughters, works as Agricultural engineer Denies smoking or drug use. Drinks alcohol socially  Diet: fairly healthy  Excerise: walking 5 days ago, rowing   Immunizations: Tdap UTD.   Health maintenance:  Last Gynecological Exam: 09/2018 Has IUD Last Dental Exam: 02/2019 Last Eye Exam: years ago   Wears seatbelt always, uses sunscreen, smoke detectors in home and functioning, does not text while driving and feels safe in home environment.   Reviewed allergies, medications, past medical, surgical, family, and social history.  Review of Systems Review of Systems Constitutional: -fever, -chills, -sweats, -unexpected weight change,-fatigue ENT: -runny nose, -ear pain, -sore throat Cardiology:  -chest pain, -palpitations, -edema Respiratory: -cough, -shortness of breath, -wheezing Gastroenterology: -abdominal pain, -nausea, -vomiting, -diarrhea, -constipation  Hematology: -bleeding or bruising problems Musculoskeletal: -arthralgias, -myalgias, -joint swelling, -back pain Ophthalmology: -vision changes Urology: -dysuria, -difficulty  urinating, -hematuria, -urinary frequency, -urgency Neurology: -headache, -weakness, -tingling, -numbness       Objective:   Physical Exam BP 110/64   Pulse 64   Temp 98.7 F (37.1 C)   Ht 5' 7.25" (1.708 m)   Wt 218 lb 6.4 oz (99.1 kg)   BMI 33.95 kg/m   General Appearance:    Alert, cooperative, no distress, appears stated age  Head:    Normocephalic, without obvious abnormality, atraumatic  Eyes:    PERRL, conjunctiva/corneas clear, EOM's intact  Ears:    Normal TM's and external ear canals  Nose:  Mask in place  Throat:  Mask in place  Neck:   Supple, no lymphadenopathy;  thyroid:  no   enlargement/tenderness/nodules; no JVD  Back:    ROM normal, no CVA     tenderness  Lungs:     Clear to auscultation bilaterally without wheezes, rales or     ronchi; respirations unlabored  Chest Wall:    No tenderness or deformity   Heart:    Regular rate and rhythm, S1 and S2 normal, no murmur, rub   or gallop  Breast Exam:   OB/GYN  Abdomen:     Soft, non-tender, nondistended, normoactive bowel sounds,    no masses, no hepatosplenomegaly  Genitalia:   OB/GYN  Rectal:    Not performed due to age<40 and no related complaints  Extremities:   No clubbing, cyanosis or edema  Pulses:   2+ and symmetric all extremities  Skin:   Skin color, texture, turgor normal, no rashes or lesions.  Irregular shaped nevus with some irregular pigmentation on her left side just  below the bra line.  Raised nevus on her left upper shoulder, slightly atypical  Lymph nodes:   Cervical, supraclavicular, and axillary nodes normal  Neurologic:   CNII-XII intact, normal strength, sensation and gait          Psych:   Normal mood, affect, hygiene and grooming.         Assessment & Plan:  Routine general medical examination at a health care facility - Plan: CBC with Differential/Platelet, Comprehensive metabolic panel, TSH, T4, free, Lipid panel  BMI 33.0-33.9,adult  Screening for thyroid disorder - Plan: TSH,  T4, free  Screening for lipid disorders - Plan: Lipid panel  Atypical nevi  She is here for fasting CPE.  Reviewed preventive health care.  She is up-to-date with Pap smear and has an OB/GYN.  Counseling done on healthy lifestyle including diet and exercise. Immunizations reviewed and up-to-date.  Discussed safety and health promotion. She will call and schedule with a dermatologist for the atypical nevi. Continue with biannual dental exams. She will let me know if the corn/callus on her great toe gets worse and I will refer her to the [facility].  She declines a referral today

## 2019-05-13 ENCOUNTER — Encounter: Payer: Self-pay | Admitting: Family Medicine

## 2019-05-13 ENCOUNTER — Other Ambulatory Visit: Payer: Self-pay

## 2019-05-13 ENCOUNTER — Ambulatory Visit: Payer: No Typology Code available for payment source | Admitting: Family Medicine

## 2019-05-13 VITALS — BP 110/64 | HR 64 | Temp 98.7°F | Ht 67.25 in | Wt 218.4 lb

## 2019-05-13 DIAGNOSIS — Z1329 Encounter for screening for other suspected endocrine disorder: Secondary | ICD-10-CM | POA: Diagnosis not present

## 2019-05-13 DIAGNOSIS — Z13220 Encounter for screening for lipoid disorders: Secondary | ICD-10-CM

## 2019-05-13 DIAGNOSIS — Z6833 Body mass index (BMI) 33.0-33.9, adult: Secondary | ICD-10-CM | POA: Diagnosis not present

## 2019-05-13 DIAGNOSIS — Z0000 Encounter for general adult medical examination without abnormal findings: Secondary | ICD-10-CM | POA: Diagnosis not present

## 2019-05-13 DIAGNOSIS — D229 Melanocytic nevi, unspecified: Secondary | ICD-10-CM

## 2019-05-14 LAB — TSH: TSH: 2.01 u[IU]/mL (ref 0.450–4.500)

## 2019-05-14 LAB — COMPREHENSIVE METABOLIC PANEL
ALT: 32 IU/L (ref 0–32)
AST: 30 IU/L (ref 0–40)
Albumin/Globulin Ratio: 1.5 (ref 1.2–2.2)
Albumin: 4.3 g/dL (ref 3.9–5.0)
Alkaline Phosphatase: 78 IU/L (ref 39–117)
BUN/Creatinine Ratio: 10 (ref 9–23)
BUN: 7 mg/dL (ref 6–20)
Bilirubin Total: 0.3 mg/dL (ref 0.0–1.2)
CO2: 22 mmol/L (ref 20–29)
Calcium: 9.5 mg/dL (ref 8.7–10.2)
Chloride: 103 mmol/L (ref 96–106)
Creatinine, Ser: 0.7 mg/dL (ref 0.57–1.00)
GFR calc Af Amer: 134 mL/min/{1.73_m2} (ref >59)
GFR calc non Af Amer: 117 mL/min/{1.73_m2} (ref >59)
Globulin, Total: 2.8 g/dL (ref 1.5–4.5)
Glucose: 92 mg/dL (ref 65–99)
Potassium: 4.6 mmol/L (ref 3.5–5.2)
Sodium: 142 mmol/L (ref 134–144)
Total Protein: 7.1 g/dL (ref 6.0–8.5)

## 2019-05-14 LAB — LIPID PANEL
Chol/HDL Ratio: 2.6 ratio (ref 0.0–4.4)
Cholesterol, Total: 144 mg/dL (ref 100–199)
HDL: 55 mg/dL (ref >39)
LDL Chol Calc (NIH): 70 mg/dL (ref 0–99)
Triglycerides: 106 mg/dL (ref 0–149)
VLDL Cholesterol Cal: 19 mg/dL (ref 5–40)

## 2019-05-14 LAB — CBC WITH DIFFERENTIAL/PLATELET
Basophils Absolute: 0 10*3/uL (ref 0.0–0.2)
Basos: 1 %
EOS (ABSOLUTE): 0.1 10*3/uL (ref 0.0–0.4)
Eos: 2 %
Hematocrit: 42.5 % (ref 34.0–46.6)
Hemoglobin: 13.9 g/dL (ref 11.1–15.9)
Immature Grans (Abs): 0 10*3/uL (ref 0.0–0.1)
Immature Granulocytes: 0 %
Lymphocytes Absolute: 1.4 10*3/uL (ref 0.7–3.1)
Lymphs: 23 %
MCH: 28.4 pg (ref 26.6–33.0)
MCHC: 32.7 g/dL (ref 31.5–35.7)
MCV: 87 fL (ref 79–97)
Monocytes Absolute: 0.5 10*3/uL (ref 0.1–0.9)
Monocytes: 7 %
Neutrophils Absolute: 4.3 10*3/uL (ref 1.4–7.0)
Neutrophils: 67 %
Platelets: 286 10*3/uL (ref 150–450)
RBC: 4.89 x10E6/uL (ref 3.77–5.28)
RDW: 12.4 % (ref 11.7–15.4)
WBC: 6.3 10*3/uL (ref 3.4–10.8)

## 2019-05-14 LAB — T4, FREE: Free T4: 1.27 ng/dL (ref 0.82–1.77)

## 2019-05-14 NOTE — Progress Notes (Signed)
Her labs are all normal

## 2019-10-20 ENCOUNTER — Other Ambulatory Visit: Payer: Self-pay

## 2019-10-20 ENCOUNTER — Ambulatory Visit: Payer: No Typology Code available for payment source | Admitting: Family Medicine

## 2019-10-20 ENCOUNTER — Encounter: Payer: Self-pay | Admitting: Family Medicine

## 2019-10-20 VITALS — BP 110/68 | HR 71 | Wt 211.0 lb

## 2019-10-20 DIAGNOSIS — Z3A11 11 weeks gestation of pregnancy: Secondary | ICD-10-CM

## 2019-10-20 DIAGNOSIS — Z8249 Family history of ischemic heart disease and other diseases of the circulatory system: Secondary | ICD-10-CM

## 2019-10-20 DIAGNOSIS — Z87898 Personal history of other specified conditions: Secondary | ICD-10-CM

## 2019-10-20 NOTE — Progress Notes (Signed)
° °  Subjective:    Patient ID: Misty Wright, female    DOB: 08-27-1990, 29 y.o.   MRN: [id number]  HPI Chief Complaint  Patient presents with   cardiac work up    brother is on heart failure and on heart transplant and recent had a procedure and doctors told her she needed to be checked out just in case   States her brother who is 22 years old was recently hospitalized with complications from A-fib and was placed on the heart transplant list. States she thinks he has heart failure now and is uncertain of other details. States her brother's cardiologist recommended that she have cardiac testing.   States she has had palpitations in the past and has not thought anything of it. No pain.   She is [redacted] weeks pregnant.   Denies fever, chills, dizziness, chest pain, palpitations, shortness of breath, abdominal pain,  LE edema.   Reviewed allergies, medications, past medical, surgical, family, and social history.     Review of Systems Pertinent positives and negatives in the history of present illness.      Objective:   Physical Exam BP 110/68    Pulse 71    Wt 211 lb (95.7 kg)    SpO2 98%    BMI 32.80 kg/m   Alert and in no distress. Cardiac exam shows a regular sinus rhythm without murmurs or gallops. Lungs are clear to auscultation. LE without edema.       Assessment & Plan:  Family history of heart disease in brother - Plan: EKG 12-Lead  [redacted] weeks gestation of pregnancy  History of palpitations - Plan: EKG 12-Lead  EKG shows a NSR, rate 71, no ST abnormalities. Read by myself and Dr. Redmond School.  Discussed that she is asymptomatic and we do not have enough information about her brothers cardiac issue to order more cardiac testing at this time. She will get more information and let me know. I am happy to do more testing or refer her to cardiology as appropriate. She is fine with this.

## 2019-10-25 LAB — OB RESULTS CONSOLE HGB/HCT, BLOOD: Hemoglobin: 13.9

## 2019-10-25 LAB — OB RESULTS CONSOLE RUBELLA ANTIBODY, IGM: Rubella: IMMUNE

## 2019-10-25 LAB — OB RESULTS CONSOLE GC/CHLAMYDIA
Chlamydia: NEGATIVE
Gonorrhea: NEGATIVE

## 2019-10-25 LAB — OB RESULTS CONSOLE HEPATITIS B SURFACE ANTIGEN: Hepatitis B Surface Ag: NEGATIVE

## 2019-10-25 LAB — OB RESULTS CONSOLE HIV ANTIBODY (ROUTINE TESTING): HIV: NONREACTIVE

## 2019-10-25 LAB — OB RESULTS CONSOLE RPR: RPR: NONREACTIVE

## 2019-10-25 LAB — HEPATITIS C ANTIBODY: HCV Ab: NEGATIVE

## 2019-12-01 ENCOUNTER — Encounter: Payer: Self-pay | Admitting: Internal Medicine

## 2019-12-12 ENCOUNTER — Telehealth: Payer: Self-pay | Admitting: Family Medicine

## 2019-12-12 NOTE — Telephone Encounter (Signed)
Pt scheduled virtual tomorrow with you.

## 2019-12-12 NOTE — Telephone Encounter (Signed)
Ok to do a virtual visit if she would like or she can see her OB/GYN. This is her decision

## 2019-12-12 NOTE — Telephone Encounter (Signed)
Called pt and she said the beginning of Sept. She had a cough and sore throat along with vomiting. She said she still has the cough and has been coughing up phlegm and she is also tried. She is also pregnant which is probably why she is vomiting and tired. She wants her lungs checked. She has an OB appt on Friday but wasn't sure if she needs to ask them to check her lungs or if she needs to be seen by you.

## 2019-12-13 ENCOUNTER — Encounter: Payer: Self-pay | Admitting: Family Medicine

## 2019-12-13 ENCOUNTER — Telehealth: Payer: BC Managed Care – PPO | Admitting: Family Medicine

## 2019-12-13 ENCOUNTER — Other Ambulatory Visit: Payer: Self-pay

## 2019-12-13 VITALS — Wt 210.0 lb

## 2019-12-13 DIAGNOSIS — R0982 Postnasal drip: Secondary | ICD-10-CM

## 2019-12-13 DIAGNOSIS — R058 Other specified cough: Secondary | ICD-10-CM | POA: Diagnosis not present

## 2019-12-13 NOTE — Progress Notes (Signed)
   Subjective:  Documentation for virtual audio and video telecommunications through [place] encounter:  The patient was located at home. 2 patient identifiers used.  The provider was located in the office. The patient did consent to this visit and is aware of possible charges through their insurance for this visit.  The other persons participating in this telemedicine service were none. Time spent on call was 15 minutes and in review of previous records 15 minutes total.  This virtual service is not related to other E/M service within previous 7 days.   Patient ID: Misty Wright, female    DOB: 1990-06-11, 29 y.o.   MRN: [id number]  HPI Chief Complaint  Patient presents with  . coughing    coughing x 1 month throughout the day everyday. phelgm coming up,    She is [redacted] weeks pregnant.  States she was sick last month with fatigue, sore throat, low grade fever.  She did have at home Covid test which was negative.   States she recently started coughing up green mucus.  She is having a lot of postnasal drainage. Using tea and hot showers.  States she is hesitant to use over-the-counter medication due to her pregnancy.  Denies fever, chills, headaches, ear pain, sore throat, chest pain, shortness of breath, wheezing, abdominal pain.  States she has had her usual nausea and occasional vomiting due to pregnancy  No other concerns today.   Review of Systems Pertinent positives and negatives in the history of present illness.     Objective:   Physical Exam Wt 210 lb (95.3 kg)   BMI 32.65 kg/m   Alert and oriented and in no acute distress.  Regular swallowing with postnasal drainage and throat clearing noticeable.  Respirations unlabored.      Assessment & Plan:  Productive cough  Post-nasal drainage  Discussed the option of having her come for Covid testing but her symptoms have been ongoing for several weeks now and unlikely Covid related.  Discussed the potential that  this could be a bacterial infection and offered antibiotic.  She would like to wait and talk to her OB/GYN.  States she has an appointment later this week. Recommend trying plain Mucinex and cough lozenges that are alcohol free since she is [redacted] weeks pregnant. Advised that if she becomes short of breath, has chest pain or develops fever that she should call me back or be seen.

## 2019-12-16 DIAGNOSIS — Z363 Encounter for antenatal screening for malformations: Secondary | ICD-10-CM | POA: Diagnosis not present

## 2019-12-16 DIAGNOSIS — Z6834 Body mass index (BMI) 34.0-34.9, adult: Secondary | ICD-10-CM | POA: Diagnosis not present

## 2020-01-11 DIAGNOSIS — Z23 Encounter for immunization: Secondary | ICD-10-CM | POA: Diagnosis not present

## 2020-02-08 LAB — OB RESULTS CONSOLE RPR: RPR: NONREACTIVE

## 2020-02-09 ENCOUNTER — Encounter: Payer: Self-pay | Admitting: Internal Medicine

## 2020-02-10 ENCOUNTER — Encounter: Payer: Self-pay | Admitting: Family Medicine

## 2020-02-25 NOTE — L&D Delivery Note (Signed)
Delivery Note Misty Wright is a [id number] at [redacted]w[redacted]d who had a spontaneous delivery at 12:40pm on 04/20/20 a viable female "Zoe" was delivered via  LOA.  APGAR:  9,  9; weight 2895g (6lb6.1oz).  Misty Wright was admitted for SROM. Progressed normally. Received epidural for pain management. Pushed for 20 minutes. Baby was delivered without difficulty. No nuchal cord. Baby placed on maternal abdomen. Delayed cord clamping for 60 seconds. Delivery of placenta was spontaneous. Placenta was found to be intact, 3-vessel cord was noted. The fundus was found to be firm. Estimated blood loss 100cc. Instrument and gauze counts were correct at the end of the procedure.   Placenta status: L&D Lacerations: None Mom to postpartum.  Baby to Couplet care / Skin to Skin.  Misty Wright K Taam-Akelman 04/20/2020, 12:54 PM

## 2020-04-09 LAB — OB RESULTS CONSOLE GBS: GBS: POSITIVE

## 2020-04-20 ENCOUNTER — Encounter (HOSPITAL_COMMUNITY): Payer: Self-pay | Admitting: Obstetrics and Gynecology

## 2020-04-20 ENCOUNTER — Inpatient Hospital Stay (HOSPITAL_COMMUNITY): Payer: 59 | Admitting: Anesthesiology

## 2020-04-20 ENCOUNTER — Inpatient Hospital Stay (HOSPITAL_COMMUNITY)
Admission: AD | Admit: 2020-04-20 | Discharge: 2020-04-22 | DRG: 807 | Disposition: A | Discharge status: Home / Self Care | Payer: 59 | Attending: Obstetrics & Gynecology | Admitting: Obstetrics & Gynecology

## 2020-04-20 ENCOUNTER — Other Ambulatory Visit: Payer: Self-pay

## 2020-04-20 DIAGNOSIS — Z8616 Personal history of COVID-19: Secondary | ICD-10-CM | POA: Diagnosis not present

## 2020-04-20 DIAGNOSIS — Z87891 Personal history of nicotine dependence: Secondary | ICD-10-CM

## 2020-04-20 DIAGNOSIS — O99824 Streptococcus B carrier state complicating childbirth: Principal | ICD-10-CM | POA: Diagnosis present

## 2020-04-20 DIAGNOSIS — Z3A37 37 weeks gestation of pregnancy: Secondary | ICD-10-CM

## 2020-04-20 DIAGNOSIS — O99214 Obesity complicating childbirth: Secondary | ICD-10-CM | POA: Diagnosis present

## 2020-04-20 DIAGNOSIS — O26893 Other specified pregnancy related conditions, third trimester: Secondary | ICD-10-CM | POA: Diagnosis present

## 2020-04-20 LAB — CBC
HCT: 37.6 % (ref 36.0–46.0)
Hemoglobin: 12.5 g/dL (ref 12.0–15.0)
MCH: 29.1 pg (ref 26.0–34.0)
MCHC: 33.2 g/dL (ref 30.0–36.0)
MCV: 87.6 fL (ref 80.0–100.0)
Platelets: 249 10*3/uL (ref 150–400)
RBC: 4.29 MIL/uL (ref 3.87–5.11)
RDW: 13.8 % (ref 11.5–15.5)
WBC: 13.1 10*3/uL — ABNORMAL HIGH (ref 4.0–10.5)
nRBC: 0 % (ref 0.0–0.2)

## 2020-04-20 LAB — POCT FERN TEST: POCT Fern Test: POSITIVE

## 2020-04-20 LAB — TYPE AND SCREEN
ABO/RH(D): O POS
Antibody Screen: NEGATIVE

## 2020-04-20 LAB — RPR: RPR Ser Ql: NONREACTIVE

## 2020-04-20 MED ORDER — OXYCODONE-ACETAMINOPHEN 5-325 MG PO TABS: 2.0000 | ORAL_TABLET | ORAL | Status: DC | PRN

## 2020-04-20 MED ORDER — EPHEDRINE 5 MG/ML INJ
10.0000 mg | INTRAVENOUS | Status: DC | PRN
  Filled 2020-04-20: qty 10

## 2020-04-20 MED ORDER — SENNOSIDES-DOCUSATE SODIUM 8.6-50 MG PO TABS
2.0000 | ORAL_TABLET | ORAL | Status: DC
  Administered 2020-04-20 – 2020-04-21 (×2): 2 via ORAL
  Filled 2020-04-20 (×2): qty 2

## 2020-04-20 MED ORDER — FLEET ENEMA 7-19 GM/118ML RE ENEM: 1.0000 | ENEMA | Freq: Every day | RECTAL | Status: DC | PRN

## 2020-04-20 MED ORDER — ACETAMINOPHEN 325 MG PO TABS
650.0000 mg | ORAL_TABLET | ORAL | Status: DC | PRN
Start: 2020-04-20 — End: 2020-04-20

## 2020-04-20 MED ORDER — PHENYLEPHRINE 40 MCG/ML (10ML) SYRINGE FOR IV PUSH (FOR BLOOD PRESSURE SUPPORT)
80.0000 ug | PREFILLED_SYRINGE | INTRAVENOUS | Status: DC | PRN
  Filled 2020-04-20: qty 10

## 2020-04-20 MED ORDER — DIBUCAINE (PERIANAL) 1 % EX OINT: 1.0000 "application " | TOPICAL_OINTMENT | CUTANEOUS | Status: DC | PRN

## 2020-04-20 MED ORDER — OXYCODONE-ACETAMINOPHEN 5-325 MG PO TABS: 1.0000 | ORAL_TABLET | ORAL | Status: DC | PRN

## 2020-04-20 MED ORDER — OXYCODONE HCL 5 MG PO TABS: 10.0000 mg | ORAL_TABLET | ORAL | Status: DC | PRN

## 2020-04-20 MED ORDER — IBUPROFEN 600 MG PO TABS
600.0000 mg | ORAL_TABLET | Freq: Four times a day (QID) | ORAL | Status: DC
  Administered 2020-04-20 – 2020-04-22 (×8): 600 mg via ORAL
  Filled 2020-04-20 (×8): qty 1

## 2020-04-20 MED ORDER — SOD CITRATE-CITRIC ACID 500-334 MG/5ML PO SOLN: 30.0000 mL | ORAL | Status: DC | PRN

## 2020-04-20 MED ORDER — OXYTOCIN BOLUS FROM INFUSION
333.0000 mL | Freq: Once | INTRAVENOUS | Status: AC
  Administered 2020-04-20: 333 mL via INTRAVENOUS

## 2020-04-20 MED ORDER — WITCH HAZEL-GLYCERIN EX PADS
1.0000 | MEDICATED_PAD | CUTANEOUS | Status: DC | PRN
Start: 2020-04-20 — End: 2020-04-22

## 2020-04-20 MED ORDER — FENTANYL CITRATE (PF) 100 MCG/2ML IJ SOLN: 50.0000 ug | INTRAMUSCULAR | Status: DC | PRN

## 2020-04-20 MED ORDER — PHENYLEPHRINE 40 MCG/ML (10ML) SYRINGE FOR IV PUSH (FOR BLOOD PRESSURE SUPPORT): 80.0000 ug | PREFILLED_SYRINGE | INTRAVENOUS | Status: DC | PRN

## 2020-04-20 MED ORDER — DIPHENHYDRAMINE HCL 25 MG PO CAPS: 25.0000 mg | ORAL_CAPSULE | Freq: Four times a day (QID) | ORAL | Status: DC | PRN

## 2020-04-20 MED ORDER — ONDANSETRON HCL 4 MG PO TABS: 4.0000 mg | ORAL_TABLET | ORAL | Status: DC | PRN

## 2020-04-20 MED ORDER — SODIUM BICARBONATE 8.4 % IV SOLN
INTRAVENOUS | Status: DC | PRN
  Administered 2020-04-20 (×2): 5 mL via EPIDURAL

## 2020-04-20 MED ORDER — LIDOCAINE HCL (PF) 1 % IJ SOLN
INTRAMUSCULAR | Status: DC | PRN
  Administered 2020-04-20 (×2): 4 mL via EPIDURAL

## 2020-04-20 MED ORDER — OXYTOCIN-SODIUM CHLORIDE 30-0.9 UT/500ML-% IV SOLN
2.5000 [IU]/h | INTRAVENOUS | Status: DC
  Administered 2020-04-20: 2.5 [IU]/h via INTRAVENOUS

## 2020-04-20 MED ORDER — SODIUM CHLORIDE 0.9 % IV SOLN: 250.0000 mL | INTRAVENOUS | Status: DC | PRN

## 2020-04-20 MED ORDER — FENTANYL-BUPIVACAINE-NACL 0.5-0.125-0.9 MG/250ML-% EP SOLN
12.0000 mL/h | EPIDURAL | Status: DC | PRN
  Administered 2020-04-20: 12 mL/h via EPIDURAL
  Filled 2020-04-20: qty 250

## 2020-04-20 MED ORDER — OXYCODONE HCL 5 MG PO TABS
5.0000 mg | ORAL_TABLET | ORAL | Status: DC | PRN
Start: 2020-04-20 — End: 2020-04-22

## 2020-04-20 MED ORDER — LACTATED RINGERS IV SOLN: INTRAVENOUS | Status: DC

## 2020-04-20 MED ORDER — BENZOCAINE-MENTHOL 20-0.5 % EX AERO: 1.0000 "application " | INHALATION_SPRAY | CUTANEOUS | Status: DC | PRN

## 2020-04-20 MED ORDER — SODIUM CHLORIDE 0.9% FLUSH
3.0000 mL | Freq: Two times a day (BID) | INTRAVENOUS | Status: DC
  Administered 2020-04-20: 3 mL via INTRAVENOUS

## 2020-04-20 MED ORDER — BISACODYL 10 MG RE SUPP: 10.0000 mg | Freq: Every day | RECTAL | Status: DC | PRN

## 2020-04-20 MED ORDER — OXYTOCIN-SODIUM CHLORIDE 30-0.9 UT/500ML-% IV SOLN
1.0000 m[IU]/min | INTRAVENOUS | Status: DC
  Administered 2020-04-20: 2 m[IU]/min via INTRAVENOUS
  Filled 2020-04-20 (×2): qty 500

## 2020-04-20 MED ORDER — DIPHENHYDRAMINE HCL 50 MG/ML IJ SOLN: 12.5000 mg | INTRAMUSCULAR | Status: DC | PRN

## 2020-04-20 MED ORDER — TERBUTALINE SULFATE 1 MG/ML IJ SOLN: 0.2500 mg | Freq: Once | INTRAMUSCULAR | Status: DC | PRN

## 2020-04-20 MED ORDER — PRENATAL MULTIVITAMIN CH
1.0000 | ORAL_TABLET | Freq: Every day | ORAL | Status: DC
  Administered 2020-04-21 – 2020-04-22 (×2): 1 via ORAL
  Filled 2020-04-20 (×2): qty 1

## 2020-04-20 MED ORDER — ONDANSETRON HCL 4 MG/2ML IJ SOLN
4.0000 mg | Freq: Four times a day (QID) | INTRAMUSCULAR | Status: DC | PRN
  Administered 2020-04-20: 4 mg via INTRAVENOUS
  Filled 2020-04-20: qty 2

## 2020-04-20 MED ORDER — FLEET ENEMA 7-19 GM/118ML RE ENEM: 1.0000 | ENEMA | RECTAL | Status: DC | PRN

## 2020-04-20 MED ORDER — ONDANSETRON HCL 4 MG/2ML IJ SOLN: 4.0000 mg | INTRAMUSCULAR | Status: DC | PRN

## 2020-04-20 MED ORDER — EPHEDRINE 5 MG/ML INJ: 10.0000 mg | INTRAVENOUS | Status: DC | PRN

## 2020-04-20 MED ORDER — ACETAMINOPHEN 325 MG PO TABS: 650.0000 mg | ORAL_TABLET | ORAL | Status: DC | PRN

## 2020-04-20 MED ORDER — SODIUM CHLORIDE 0.9 % IV SOLN
5.0000 10*6.[IU] | Freq: Once | INTRAVENOUS | Status: AC
  Administered 2020-04-20: 5 10*6.[IU] via INTRAVENOUS
  Filled 2020-04-20: qty 5

## 2020-04-20 MED ORDER — LACTATED RINGERS IV SOLN: 500.0000 mL | Freq: Once | INTRAVENOUS | Status: DC

## 2020-04-20 MED ORDER — LACTATED RINGERS IV SOLN
500.0000 mL | INTRAVENOUS | Status: DC | PRN
  Administered 2020-04-20: 1000 mL via INTRAVENOUS
  Administered 2020-04-20: 500 mL via INTRAVENOUS

## 2020-04-20 MED ORDER — LIDOCAINE HCL (PF) 1 % IJ SOLN: 30.0000 mL | INTRAMUSCULAR | Status: DC | PRN

## 2020-04-20 MED ORDER — SIMETHICONE 80 MG PO CHEW: 80.0000 mg | CHEWABLE_TABLET | ORAL | Status: DC | PRN

## 2020-04-20 MED ORDER — COCONUT OIL OIL: 1.0000 "application " | TOPICAL_OIL | Status: DC | PRN

## 2020-04-20 MED ORDER — SODIUM CHLORIDE 0.9% FLUSH
3.0000 mL | INTRAVENOUS | Status: DC | PRN
Start: 2020-04-20 — End: 2020-04-21

## 2020-04-20 MED ORDER — PENICILLIN G POT IN DEXTROSE 60000 UNIT/ML IV SOLN
3.0000 10*6.[IU] | INTRAVENOUS | Status: DC
  Administered 2020-04-20: 3 10*6.[IU] via INTRAVENOUS
  Filled 2020-04-20: qty 50

## 2020-04-20 NOTE — Anesthesia Preprocedure Evaluation (Signed)
Anesthesia Evaluation  Patient identified by MRN, date of birth, ID band Patient awake    Reviewed: Allergy & Precautions, H&P , Patient's Chart, lab work & pertinent test results  Airway Mallampati: II  TM Distance: >3 FB Neck ROM: full    Dental no notable dental hx. (+) Teeth Intact   Pulmonary neg pulmonary ROS, former smoker,    Pulmonary exam normal breath sounds clear to auscultation       Cardiovascular negative cardio ROS Normal cardiovascular exam Rhythm:regular Rate:Normal     Neuro/Psych PSYCHIATRIC DISORDERS Depression negative neurological ROS     GI/Hepatic negative GI ROS, Neg liver ROS,   Endo/Other  Obesity  Renal/GU negative Renal ROS  negative genitourinary   Musculoskeletal   Abdominal   Peds  Hematology negative hematology ROS (+)   Anesthesia Other Findings   Reproductive/Obstetrics (+) Pregnancy                             Anesthesia Physical Anesthesia Plan  ASA: II  Anesthesia Plan: Epidural   Post-op Pain Management:    Induction:   PONV Risk Score and Plan:   Airway Management Planned:   Additional Equipment:   Intra-op Plan:   Post-operative Plan:   Informed Consent: I have reviewed the patients History and Physical, chart, labs and discussed the procedure including the risks, benefits and alternatives for the proposed anesthesia with the patient or authorized representative who has indicated his/her understanding and acceptance.       Plan Discussed with: Anesthesiologist  Anesthesia Plan Comments:         Anesthesia Quick Evaluation

## 2020-04-20 NOTE — MAU Note (Signed)
Leaking clear fld since 0200. Ctxs started shortly after SROM. Was 3/70% THurs am

## 2020-04-20 NOTE — Progress Notes (Signed)
Pt up to BR and then to BS via w/c

## 2020-04-20 NOTE — Lactation Note (Signed)
This note was copied from a baby's chart. Lactation Consultation Note  Patient Name: Misty Wright Date: 04/20/2020 Reason for consult: Initial assessment;Mother's request;Early term 37-38.6wks Age:32 hours Infant last fed for 15 minutes had 1530. Mom has widely spaced breasts and nipples are small.  LC attempted to latch infant football and cross cradle, infant sleepy at the breasts. LC taught Mom hand expression and spoon feeding.  Mom set up on DEBP to pump q 3hrs for 15 minutes. Mom pre pump before latching to bring nipples out.   Plan 1. To feed based on cues 8-12x in 24 hrs no more than 3 hours without an attempt. Mom to offer both breasts and look for signs of milk transfer.          2. Mom to use DEPB as stated above.           3. I and O sheet reviewed.            [place] brochure of inpatient and outpatient services reviewed.  All questions answered at the end of the visit.  Maternal Data Has patient been taught Hand Expression?: Yes Does the patient have breastfeeding experience prior to this delivery?: Yes How long did the patient breastfeed?: 2 children for 1 year. First child had a difficult latch.  Feeding Mother's Current Feeding Choice: Breast Milk  LATCH Score Latch: Too sleepy or reluctant, no latch achieved, no sucking elicited.  Audible Swallowing: None  Type of Nipple: Everted at rest and after stimulation  Comfort (Breast/Nipple): Soft / non-tender  Hold (Positioning): No assistance needed to correctly position infant at breast.  LATCH Score: 6   Lactation Tools Discussed/Used Tools: Pump;Flanges Flange Size: 27 Breast pump type: Double-Electric Breast Pump Pump Education: Setup, frequency, and cleaning;Milk Storage Reason for Pumping: Increase stimulation Pumping frequency: every 3 hours for 15 minutes  Interventions Interventions: Breast feeding basics reviewed;Support pillows;Education;Assisted with latch;Position options;Skin to  skin;Expressed milk;Breast massage;Hand express;Pre-pump if needed;Breast compression;DEBP;Adjust position  Discharge Pump: Personal WIC Program: No  Consult Status Consult Status: Follow-up Date: 04/21/20 Follow-up type: In-patient    Crystal  Nicholson-Springer 04/20/2020, 7:25 PM

## 2020-04-20 NOTE — Lactation Note (Signed)
This note was copied from a baby's chart. Lactation Consultation Note  Patient Name: Misty Wright [id number] Date: 04/20/2020 Reason for consult: L&D Initial assessment;Early term 37-38.6wks Age:32 hours  Visited with mom of < 1 hour old ETI female, she's a P3 and experienced BF. Baby already nursing when exiting the room, but one of her arms was not positioned correctly and not giving her a deep enough latch. LC showed mom how to break the latch to reposition baby in cross cradle hold on the right breast.   After a few attempts, baby latched again and started sucking rhythmically. Baby fed off both breast for a total of 25 minutes. Reviewed normal newborn behavior, feeding cues, cluster feeding and size of baby's stomach.   Mom had a few questions about waking baby up to eat, baby hasn't being weighed yet, but advised mom to try to check the diaper around the 3rd hour mark if baby has not cued, that usually will wake them up, and take her STS afterwards to attempt a feeding if baby is interested.   Feeding plan:  1. Encouraged mom to feed baby STS 8-12 times/24 hours or sooner if feeding cues are present 2. Hand expression and breast massage were also encouraged prior feeding  No literature provided due to the nature of this L&D consultation. No support person in mother's room at the time of [facility] consultation. Mom reported all questions and concerns were answered, she's aware of [facility] OP services and will call PRN.   Maternal Data Has patient been taught Hand Expression?: Yes Does the patient have breastfeeding experience prior to this delivery?: Yes How long did the patient breastfeed?: BF both of her kids for one year each  Feeding Mother's Current Feeding Choice: Breast Milk  LATCH Score Latch: Grasps breast easily, tongue down, lips flanged, rhythmical sucking.  Audible Swallowing: A few with stimulation  Type of Nipple: Everted at rest and after stimulation  Comfort  (Breast/Nipple): Soft / non-tender  Hold (Positioning): Assistance needed to correctly position infant at breast and maintain latch.  LATCH Score: 8   Lactation Tools Discussed/Used    Interventions Interventions: Breast feeding basics reviewed;Assisted with latch;Skin to skin;Breast massage;Hand express;Breast compression;Adjust position;Support pillows  Discharge Pump: Personal Bernita Buffy DEBP at home) [facility] Program: No  Consult Status Consult Status: Follow-up Date: 04/20/20 Follow-up type: In-patient    Misty Wright 04/20/2020, 1:43 PM

## 2020-04-20 NOTE — Social Work (Signed)
MOB was referred for history of depression.   * Referral screened out by Clinical Social Worker because none of the following criteria appear to apply:  ~ History of anxiety/depression during this pregnancy, or of post-partum depression following prior delivery. MOB hx of PPD following first child in 2013. ~ Diagnosis of anxiety and/or depression within last 3 years. OR * MOB's symptoms currently being treated with medication and/or therapy.  Please contact the Clinical Social Worker if needs arise, by [facility] request, or if MOB scores greater than 9/yes to question 10 on Edinburgh Postpartum Depression Screen.  Darra Lis, [place] [employer]  [phone]

## 2020-04-20 NOTE — Plan of Care (Signed)

## 2020-04-20 NOTE — Progress Notes (Signed)
Dr Harrington Challenger notified of pt's admission and status. Aware of SROm cl fld at 0200, ctx pattern, no sve done due to SROM and irreg ctxs. Will admit to [facility]

## 2020-04-20 NOTE — H&P (Signed)
Misty Wright is a 30 y.o. female G3P2002 [redacted]w[redacted]d presenting for LOF at 2am. She reports contractions. Denies VB. Reports normal FM.   Pregnancy c/b: 1. Obesity: current BMI 37 2. COVID infection: positive 03/15/2020, recovered  OB History    Gravida  3   Para  2   Term  2   Preterm      AB      Living  2     SAB      IAB      Ectopic      Multiple      Live Births  2          Past Medical History:  Diagnosis Date  . Postpartum depression   . Pyelonephritis    Past Surgical History:  Procedure Laterality Date  . arm surgery Left    age 6  . WISDOM TOOTH EXTRACTION     Family History: family history includes AAA (abdominal aortic aneurysm) in her paternal grandmother; Alcohol abuse in her maternal grandfather; Cancer in her maternal grandmother; Dementia in her paternal grandmother; Depression in her mother; Early death in her maternal grandmother; Mental illness in her mother. Social History:  reports that she quit smoking about 7 years ago. Her smoking use included cigarettes. She has never used smokeless tobacco. She reports current alcohol use of about 2.0 standard drinks of alcohol per week. She reports that she does not use drugs.     Maternal Diabetes: No Genetic Screening: Normal  - first trimester screen Maternal Ultrasounds/Referrals: Normal anatomy, posterior placenta Fetal Ultrasounds or other Referrals:  none Maternal Substance Abuse:  No Significant Maternal Medications:  None Significant Maternal Lab Results:  Group B Strep positive Other Comments:  None  Review of Systems Per HPI Exam Physical Exam  Dilation: 4 Effacement (%): 90 Station: -1,-2 Exam by:: Petra Kuba Blood pressure 134/62, pulse 88, temperature 98.2 F (36.8 C), temperature source Oral, resp. rate 16, height 5\' 6"  (1.676 m), weight 106.1 kg, SpO2 99 %. NAD, resting comfortably, painful with contractions Gravid abdomen Fetal testing: FHR 150, Cat 1. Toco q29m Prenatal  labs: ABO, Rh:  --/--/O POS (02/25 0630) Antibody: NEG (02/25 0630) Rubella: Immune (08/31 0000) RPR: Nonreactive (12/15 0000)  HBsAg: Negative (08/31 0000)  HIV: Non-reactive (08/31 0000)  GBS: Positive/-- (02/14 0000)   Assessment/Plan: Misty Wright 29 y.o. G3P2002 at [redacted]w[redacted]d here with SROM 1. Labor: on admit SROM confirmed, was 4/90/-2, minimal ctx, pitocin started to augment. GBS+, on PCN 2. Vaccines: s/p COVID vaccines 03/2019, Tdap and flu in pregnancy  Misty Wright 04/20/2020, 8:18 AM

## 2020-04-20 NOTE — Anesthesia Procedure Notes (Signed)
Epidural Patient location during procedure: OB Start time: 04/20/2020 8:17 AM End time: 04/20/2020 8:25 AM  Staffing Anesthesiologist: Josephine Igo, MD Performed: anesthesiologist   Preanesthetic Checklist Completed: patient identified, IV checked, site marked, risks and benefits discussed, surgical consent, monitors and equipment checked, pre-op evaluation and timeout performed  Epidural Patient position: sitting Prep: DuraPrep and site prepped and draped Patient monitoring: continuous pulse ox and blood pressure Approach: midline Location: L4-L5 Injection technique: LOR air  Needle:  Needle type: Tuohy  Needle gauge: 17 G Needle length: 9 cm and 9 Needle insertion depth: 6 cm Catheter type: closed end flexible Catheter size: 19 Gauge Catheter at skin depth: 11 cm Test dose: negative and Other  Assessment Events: blood not aspirated, injection not painful, no injection resistance, paresthesia and negative IV test  Additional Notes Patient identified. Risks and benefits discussed including failed block, incomplete  Pain control, post dural puncture headache, nerve damage, paralysis, blood pressure Changes, nausea, vomiting, reactions to medications-both toxic and allergic and post Partum back pain. All questions were answered. Patient expressed understanding and wished to proceed. Sterile technique was used throughout procedure. Epidural site was Dressed with sterile barrier dressing. No signs of intravascular injection Or signs of intrathecal spread were encountered. Transient paresthesia right thigh.  Patient was more comfortable after the epidural was dosed. Please see RN's note for documentation of vital signs and FHR which are stable. Reason for block:procedure for pain

## 2020-04-21 LAB — CBC
HCT: 33.4 % — ABNORMAL LOW (ref 36.0–46.0)
Hemoglobin: 11.3 g/dL — ABNORMAL LOW (ref 12.0–15.0)
MCH: 29.7 pg (ref 26.0–34.0)
MCHC: 33.8 g/dL (ref 30.0–36.0)
MCV: 87.7 fL (ref 80.0–100.0)
Platelets: 202 10*3/uL (ref 150–400)
RBC: 3.81 MIL/uL — ABNORMAL LOW (ref 3.87–5.11)
RDW: 13.8 % (ref 11.5–15.5)
WBC: 12.4 10*3/uL — ABNORMAL HIGH (ref 4.0–10.5)
nRBC: 0 % (ref 0.0–0.2)

## 2020-04-21 NOTE — Lactation Note (Signed)
This note was copied from a baby's chart. Lactation Consultation Note  Patient Name: Misty Wright Date: 04/21/2020 Reason for consult: Follow-up assessment Age:32 hours   P3 mother whose infant is now 40 hours old.  This is an ETI at 37+4 weeks.  Mother breast fed her other two children (now 62 and 30 years old) for 1 year.  Upon chart review I noticed that baby has been going more than three hours between feedings.  Mother stated that she has been sleepy.  Mother has a DEBP set up at bedside but has not been pumping consistently.  She has only pumped one time today.  Reviewed the importance of frequent feeding and pumping due to baby's early gestational age and size.  Offered supplementation, however, mother is not interested at this time.  If it becomes medically necessary she will supplement. We will observe baby now that she is greater than 24 hours of age, mother will begin feeding more frequently and pump every three hours.  Suggested mother call the RN if she has any difficulty with waking or feeding baby.  RN updated and asked to have night shift follow baby closely for adequate feedings.      Maternal Data    Feeding    LATCH Score                    Lactation Tools Discussed/Used    Interventions    Discharge    Consult Status Consult Status: Follow-up Date: 04/22/20 Follow-up type: In-patient    [name] 04/21/2020, 4:52 PM

## 2020-04-21 NOTE — Plan of Care (Signed)
  Problem: Activity: Goal: Will verbalize the importance of balancing activity with adequate rest periods Outcome: Progressing   Problem: Activity: Goal: Ability to tolerate increased activity will improve Outcome: Progressing   Problem: Coping: Goal: Ability to identify and utilize available resources and services will improve Outcome: Progressing   Problem: Role Relationship: Goal: Ability to demonstrate positive interaction with newborn will improve Outcome: Progressing   Problem: Life Cycle: Goal: Chance of risk for complications during the postpartum period will decrease Outcome: Progressing

## 2020-04-21 NOTE — Anesthesia Postprocedure Evaluation (Signed)
Anesthesia Post Note  Patient: Misty Wright  Procedure(s) Performed: AN AD HOC LABOR EPIDURAL     Patient location during evaluation: Mother Baby Anesthesia Type: Epidural Level of consciousness: awake and alert Pain management: pain level controlled Vital Signs Assessment: post-procedure vital signs reviewed and stable Respiratory status: spontaneous breathing, nonlabored ventilation and respiratory function stable Cardiovascular status: stable Postop Assessment: no headache, no backache, epidural receding, no apparent nausea or vomiting, patient able to bend at knees, adequate PO intake and able to ambulate Anesthetic complications: no   No complications documented.  Last Vitals:  Vitals:   04/21/20 0035 04/21/20 0420  BP: 103/73 (!) 98/58  Pulse: 79 79  Resp: 16 16  Temp: 36.6 C 36.6 C  SpO2: 98% 98%    Last Pain:  Vitals:   04/21/20 0748  TempSrc:   PainSc: 0-No pain   Pain Goal: Patients Stated Pain Goal: 0 (04/20/20 0517)              Epidural/Spinal Function Cutaneous sensation: Normal sensation (04/21/20 0748), Patient able to flex knees: Yes (04/21/20 0748), Patient able to lift hips off bed: Yes (04/21/20 0748), Back pain beyond tenderness at insertion site: No (04/21/20 0748), Progressively worsening motor and/or sensory loss: No (04/21/20 0748), Bowel and/or bladder incontinence post epidural: No (04/21/20 0748)  Jabier Mutton

## 2020-04-21 NOTE — Progress Notes (Signed)
Post Partum Day 1 Subjective: no complaints, up ad lib, voiding, tolerating PO and has had a bowel movement  Objective: Vitals:   04/20/20 1604 04/20/20 2004 04/21/20 0035 04/21/20 0420  BP: 113/63 (!) 114/58 103/73 (!) 98/58  Pulse: 79 79 79 79  Resp: 16 18 16 16   Temp: 98.2 F (36.8 C) 98.7 F (37.1 C) 97.9 F (36.6 C) 97.8 F (36.6 C)  TempSrc: Oral Oral Oral Oral  SpO2: 100% 99% 98% 98%  Weight:      Height:       Physical Exam:  General: alert Lochia: appropriate Uterine Fundus: firm DVT Evaluation: No evidence of DVT seen on physical exam.  Recent Labs    04/20/20 0630 04/21/20 0515  WBC 13.1* 12.4*  HGB 12.5 11.3*  HCT 37.6 33.4*  PLT 249 202   Assessment/Plan: Misty Wright 30 y.o. [id number] PPD#1 sp SVD at [redacted]w[redacted]d, plan for afternoon discharge or discharge tomorrow 1. PPC: no lacerations, EBL 200cc, Hgb 12.5>11.3. Doing appropriately  2. COVID infection: positive 03/15/2020, recovered 3. Vaccines: s/p COVID vaccines 03/2019, Tdap and flu in pregnancy Rubella immune, blood type O+, breastfeeding, baby girl in the room   LOS: 1 day   Jamiyah Dingley K Taam-Akelman 04/21/2020, 6:58 AM

## 2020-04-22 MED ORDER — ACETAMINOPHEN 325 MG PO TABS: 650.0000 mg | ORAL_TABLET | ORAL | 1 refills | Status: DC | PRN

## 2020-04-22 MED ORDER — IBUPROFEN 600 MG PO TABS: 600.0000 mg | ORAL_TABLET | Freq: Four times a day (QID) | ORAL | 0 refills | Status: DC

## 2020-04-22 NOTE — Discharge Instructions (Signed)

## 2020-04-22 NOTE — Progress Notes (Signed)
Post Partum Day 2 Subjective: Doing well, desires DC today. Tolerating PO, pain controlled, minimal bleeding.   Objective: Vitals:   04/21/20 0035 04/21/20 0420 04/21/20 1424 04/21/20 2135  BP: 103/73 (!) 98/58 110/67 117/62  Pulse: 79 79 71 67  Resp: 16 16 17 18   Temp: 97.9 F (36.6 C) 97.8 F (36.6 C) 97.8 F (36.6 C) 98 F (36.7 C)  TempSrc: Oral Oral Oral Oral  SpO2: 98% 98% 98%   Weight:      Height:       Physical Exam:  General: alert Lochia: appropriate Uterine Fundus: firm DVT Evaluation: No evidence of DVT seen on physical exam.  Recent Labs    04/20/20 0630 04/21/20 0515  WBC 13.1* 12.4*  HGB 12.5 11.3*  HCT 37.6 33.4*  PLT 249 202   Assessment/Plan: Misty Wright 30 y.o. G3P3003 PPD#2 sp SVD at [redacted]w[redacted]d, plan for DC today 1. PPC: no lacerations, EBL 200cc, Hgb 12.5>11.3. Doing appropriately  2. COVID infection: positive 03/15/2020, recovered. Discussed COVID booster, recommended, declines inpatient, planning to outpatient  3. Vaccines: s/p COVID vaccines 03/2019, Tdap and flu in pregnancy Rubella immune, blood type O+, breastfeeding, baby girl in the room   LOS: 2 days   Misty Wright K Taam-Akelman 04/22/2020, 6:47 AM

## 2020-04-22 NOTE — Progress Notes (Signed)
Pt c/o pain in the hamstring/posterior thigh area under buttocks.  She states it feels like a pulled muscle and is very sore.  It started with one leg and progressed to both.  Dr. Wendi Snipes called over [place] when then spoke to pt and SO about it.  She advised pt to apply heat/cold and to take Ibuprofen and Tylenol.    Pt verbalized understanding that if this became worse she would call the OB office.

## 2020-04-22 NOTE — Discharge Summary (Signed)
   Postpartum Discharge Summary   Patient Name: Misty Wright DOB: 05/24/1990 MRN: [id number]  Date of admission: 04/20/2020 Delivery date:04/20/2020  Delivering provider: TAAM-AKELMAN,  K  Date of discharge: 04/22/2020  Admitting diagnosis: Indication for care in labor or delivery [O75.9] Intrauterine pregnancy: [redacted]w[redacted]d     Secondary diagnosis:  Active Problems:   Indication for care in labor or delivery   SVD (spontaneous vaginal delivery)  Additional problems: Obesity    Discharge diagnosis: Term Pregnancy Delivered                                              Post partum procedures:none Augmentation: Pitocin Complications: None  Hospital course: Onset of Labor With Vaginal Delivery      30 y.o. yo G3P3003 at [redacted]w[redacted]d was admitted in Latent Labor on 04/20/2020. Patient had an uncomplicated labor course as follows:  Membrane Rupture Time/Date: 3:00 AM ,04/20/2020   Delivery Method:Vaginal, Spontaneous  Episiotomy: None  Lacerations:  None  Patient had an uncomplicated postpartum course.  She is ambulating, tolerating a regular diet, passing flatus, and urinating well. Patient is discharged home in stable condition on 04/22/20.  Newborn Data: Birth date:04/20/2020  Birth time:12:40 PM  Gender:Female  Living status:Living  Apgars:9 ,9  Weight:2895 g   Magnesium Sulfate received: No BMZ received: No Rhophylac:N/A MMR:N/A T-DaP:Given prenatally Flu: Given prenatally Transfusion:No  Physical exam  Vitals:   04/21/20 0035 04/21/20 0420 04/21/20 1424 04/21/20 2135  BP: 103/73 (!) 98/58 110/67 117/62  Pulse: 79 79 71 67  Resp: 16 16 17 18  Temp: 97.9 F (36.6 C) 97.8 F (36.6 C) 97.8 F (36.6 C) 98 F (36.7 C)  TempSrc: Oral Oral Oral Oral  SpO2: 98% 98% 98%   Weight:      Height:       General: alert Lochia: appropriate Uterine Fundus: firm DVT Evaluation: No evidence of DVT seen on physical exam.  Labs: Lab Results  Component Value Date   WBC 12.4 (H)  04/21/2020   HGB 11.3 (L) 04/21/2020   HCT 33.4 (L) 04/21/2020   MCV 87.7 04/21/2020   PLT 202 04/21/2020   CMP Latest Ref Rng & Units 05/13/2019  Glucose 65 - 99 mg/dL 92  BUN 6 - 20 mg/dL 7  Creatinine 0.57 - 1.00 mg/dL 0.70  Sodium 134 - 144 mmol/L 142  Potassium 3.5 - 5.2 mmol/L 4.6  Chloride 96 - 106 mmol/L 103  CO2 20 - 29 mmol/L 22  Calcium 8.7 - 10.2 mg/dL 9.5  Total Protein 6.0 - 8.5 g/dL 7.1  Total Bilirubin 0.0 - 1.2 mg/dL 0.3  Alkaline Phos 39 - 117 IU/L 78  AST 0 - 40 IU/L 30  ALT 0 - 32 IU/L 32   Edinburgh Score: Edinburgh Postnatal Depression Scale Screening Tool 04/21/2020  I have been able to laugh and see the funny side of things. 0  I have looked forward with enjoyment to things. 0  I have blamed myself unnecessarily when things went wrong. 1  I have been anxious or worried for no good reason. 1  I have felt scared or panicky for no good reason. 1  Things have been getting on top of me. 0  I have been so unhappy that I have had difficulty sleeping. 0  I have felt sad or miserable. 1  I have been so unhappy that   I have been crying. 1  The thought of harming myself has occurred to me. 0  Edinburgh Postnatal Depression Scale Total 5      After visit meds:  Allergies as of 04/22/2020      Reactions   Mold Extract [trichophyton] Other (See Comments)   Caused sinus infection.      Medication List    TAKE these medications   acetaminophen 325 MG tablet Commonly known as: Tylenol Take 2 tablets (650 mg total) by mouth every 4 (four) hours as needed (for pain scale < 4).   ibuprofen 600 MG tablet Commonly known as: ADVIL Take 1 tablet (600 mg total) by mouth every 6 (six) hours.   prenatal multivitamin Tabs tablet Take 1 tablet by mouth daily at 12 noon.        Discharge home in stable condition Infant Feeding: Breast Infant Disposition:home with mother Discharge instruction: per After Visit Summary and Postpartum booklet. Activity: Advance as  tolerated. Pelvic rest for 6 weeks.  Diet: routine diet Anticipated Birth Control: Unsure Postpartum Appointment:4 weeks Additional Postpartum F/U: Postpartum Depression checkup Future Appointments: Future Appointments  Date Time Provider [place]  05/14/2020  8:15 AM Girtha Rm, NP-C [facility]   Follow up Visit:  Follow-up Information    Taam-Akelman, Lawrence Santiago, MD. Schedule an appointment as soon as possible for a visit in 4 week(s).   Specialty: Obstetrics and Gynecology Contact information: [address] [place] [address] [phone]                   04/22/2020 Jonelle Sidle, MD

## 2020-04-22 NOTE — Lactation Note (Signed)
This note was copied from a baby's chart. Lactation Consultation Note  Patient Name: Misty Wright [id number] Date: 04/22/2020 Reason for consult: Follow-up assessment Age:32 hours   P3 mother whose infant is now 65 hours old.  This is an ETI at 37+4 weeks.  Mother breast fed her other two children (now 49 and 60 years old) for 1 year.  Baby has a 9% weight loss this morning.  Reviewed concepts discussed yesterday with family.  Mother has improved with feeding baby every three hours, however, many of the feedings have been long in duration.  With the increased number of feedings and the high weight loss I would questions whether the baby has been effectively feeding.  Discussed the "ETI" characteristics as they relate to feeding.  Mother has not been able to pump with the DEBP due to baby feeding so frequently for long durations during the night.  No supplementation was started as discussed in our conversation yesterday.  Suggested parents begin supplementation now and explained the benefits to baby.  Praised mother for her continued efforts in feeding and pumping and reassured her that many early babies go through the initial process of losing weight and needing supplementation.  Discussed energy loss and sleepiness and how to minimize this. Parents verbalized understanding.    During my visit MD arrived and also suggested supplementation.  If parents supplement with formula and baby is able to feed adequately the current plan is to discharge family today.  Mother very emotional but understanding.  Emotional support provided.  Asked permission to begin formula supplementation and family agreeable.  Baby will be supplemented with Similac.  Demonstrated paced bottle feeding and baby easily consumed 20 mls within 5 minutes.  Burped well and placed in bassinet for sleep.  Observed mother pumping and provided more reassurance with pumping.  Used coconut oil to lubricate flanges and nipples/areolas prior  to pumping.  Mother tolerating pump well as I left the room.  Reminded to feed again by 1300 at the latest and to call for any feeding difficulties.  Encouraged 20-30 mls with the next feeding.  Parents appreciative.  I believe mother is feeling a little bit more comfortable now with the plan.  Reassured her that this is a short term plan until her breast milk is in full supply.  Rn updated.    Maternal Data    Feeding    LATCH Score                    Lactation Tools Discussed/Used Tools: Bottle  Interventions Interventions: Education  Discharge    Consult Status Consult Status: Follow-up Date: 04/22/20 Follow-up type: In-patient    Beth R DelFava 04/22/2020, 10:13 AM

## 2020-05-07 ENCOUNTER — Inpatient Hospital Stay (HOSPITAL_COMMUNITY): Admit: 2020-05-07 | Payer: Self-pay

## 2020-05-13 NOTE — Patient Instructions (Signed)
Preventive Care 21-32 Years Old, Female Preventive care refers to lifestyle choices and visits with your health care provider that can promote health and wellness. This includes:  A yearly physical exam. This is also called an annual wellness visit.  Regular dental and eye exams.  Immunizations.  Screening for certain conditions.  Healthy lifestyle choices, such as: ? Eating a healthy diet. ? Getting regular exercise. ? Not using drugs or products that contain nicotine and tobacco. ? Limiting alcohol use. What can I expect for my preventive care visit? Physical exam Your health care provider may check your:  Height and weight. These may be used to calculate your BMI (body mass index). BMI is a measurement that tells if you are at a healthy weight.  Heart rate and blood pressure.  Body temperature.  Skin for abnormal spots. Counseling Your health care provider may ask you questions about your:  Past medical problems.  Family's medical history.  Alcohol, tobacco, and drug use.  Emotional well-being.  Home life and relationship well-being.  Sexual activity.  Diet, exercise, and sleep habits.  Work and work environment.  Access to firearms.  Method of birth control.  Menstrual cycle.  Pregnancy history. What immunizations do I need? Vaccines are usually given at various ages, according to a schedule. Your health care provider will recommend vaccines for you based on your age, medical history, and lifestyle or other factors, such as travel or where you work.   What tests do I need? Blood tests  Lipid and cholesterol levels. These may be checked every 5 years starting at age 20.  Hepatitis C test.  Hepatitis B test. Screening  Diabetes screening. This is done by checking your blood sugar (glucose) after you have not eaten for a while (fasting).  STD (sexually transmitted disease) testing, if you are at risk.  BRCA-related cancer screening. This may be  done if you have a family history of breast, ovarian, tubal, or peritoneal cancers.  Pelvic exam and Pap test. This may be done every 3 years starting at age 21. Starting at age 30, this may be done every 5 years if you have a Pap test in combination with an HPV test. Talk with your health care provider about your test results, treatment options, and if necessary, the need for more tests.   Follow these instructions at home: Eating and drinking  Eat a healthy diet that includes fresh fruits and vegetables, whole grains, lean protein, and low-fat dairy products.  Take vitamin and mineral supplements as recommended by your health care provider.  Do not drink alcohol if: ? Your health care provider tells you not to drink. ? You are pregnant, may be pregnant, or are planning to become pregnant.  If you drink alcohol: ? Limit how much you have to 0-1 drink a day. ? Be aware of how much alcohol is in your drink. In the [place] one drink equals one 12 oz bottle of beer (355 mL), one 5 oz glass of wine (148 mL), or one 1 oz glass of hard liquor (44 mL).   Lifestyle  Take daily care of your teeth and gums. Brush your teeth every morning and night with fluoride toothpaste. Floss one time each day.  Stay active. Exercise for at least 30 minutes 5 or more days each week.  Do not use any products that contain nicotine or tobacco, such as cigarettes, e-cigarettes, and chewing tobacco. If you need help quitting, ask your health care provider.  Do not   use drugs.  If you are sexually active, practice safe sex. Use a condom or other form of protection to prevent STIs (sexually transmitted infections).  If you do not wish to become pregnant, use a form of birth control. If you plan to become pregnant, see your health care provider for a prepregnancy visit.  Find healthy ways to cope with stress, such as: ? Meditation, yoga, or listening to music. ? Journaling. ? Talking to a trusted  person. ? Spending time with friends and family. Safety  Always wear your seat belt while driving or riding in a vehicle.  Do not drive: ? If you have been drinking alcohol. Do not ride with someone who has been drinking. ? When you are tired or distracted. ? While texting.  Wear a helmet and other protective equipment during sports activities.  If you have firearms in your house, make sure you follow all gun safety procedures.  Seek help if you have been physically or sexually abused. What's next?  Go to your health care provider once a year for an annual wellness visit.  Ask your health care provider how often you should have your eyes and teeth checked.  Stay up to date on all vaccines. This information is not intended to replace advice given to you by your health care provider. Make sure you discuss any questions you have with your health care provider. Document Revised: 10/09/2019 Document Reviewed: 10/22/2017 Elsevier Patient Education  2021 [employer]  

## 2020-05-13 NOTE — Progress Notes (Signed)
Subjective:    Patient ID: Misty Wright, female    DOB: [birth year], 30 y.o.   MRN: [id number]  HPI Chief Complaint  Patient presents with  . nonfasting cpe    Nonfasting cpe. Obgyn, no concerns   She is here for a complete physical exam.  Other providers: [facility]N- [facility]   She gave birth 3 weeks ago. Vaginal delivery.  Breastfeeding.   Complains of a corn on her right great toe that is been present for 3 years or so.  She has tried several over-the-counter remedies and would like to have this removed.   Social history: Lives with spouse and 3 daughters, works from home 4 days per week and in office 1 day.  Denies smoking, drinking alcohol, drug use  Diet: Working on a healthier diet since delivery of her daughter Excerise: Plans to increase activity level  Immunizations: Up-to-date  Health maintenance:  Mammogram: N/A Colonoscopy: N/a Last Gynecological Exam: 3 weeks ago  Last Menstrual cycle: recent pregnancy  Last Dental Exam: scheduled next week  Last Eye Exam: years ago   Wears seatbelt always, uses sunscreen, smoke detectors in home and functioning, does not text while driving and feels safe in home environment.   Reviewed allergies, medications, past medical, surgical, family, and social history.   Review of Systems Review of Systems Constitutional: -fever, -chills, -sweats, -unexpected weight change,-fatigue ENT: -runny nose, -ear pain, -sore throat Cardiology:  -chest pain, -palpitations, -edema Respiratory: -cough, -shortness of breath, -wheezing Gastroenterology: -abdominal pain, -nausea, -vomiting, -diarrhea, -constipation  Hematology: -bleeding or bruising problems Musculoskeletal: -arthralgias, -myalgias, -joint swelling, -back pain Ophthalmology: -vision changes Urology: -dysuria, -difficulty urinating, -hematuria, -urinary frequency, -urgency Neurology: -headache, -weakness, -tingling, -numbness       Objective:   Physical  Exam BP 110/64   Pulse 72   Ht 5' 7.25" (1.708 m)   Wt 219 lb 9.6 oz (99.6 kg)   Breastfeeding Yes   BMI 34.14 kg/m   General Appearance:    Alert, cooperative, no distress, appears stated age  Head:    Normocephalic, without obvious abnormality, atraumatic  Eyes:    PERRL, conjunctiva/corneas clear, EOM's intact  Ears:    Normal TM's and external ear canals  Nose:   Mask on   Throat:   Mask on   Neck:   Supple, no lymphadenopathy;  thyroid:  no   enlargement/tenderness/nodules; no JVD  Back:    Spine nontender, no curvature, ROM normal, no CVA     tenderness  Lungs:     Clear to auscultation bilaterally without wheezes, rales or     ronchi; respirations unlabored  Chest Wall:    No tenderness or deformity   Heart:    Regular rate and rhythm, S1 and S2 normal, no murmur, rub   or gallop  Breast Exam:    OB/GYN  Abdomen:     Soft, non-tender, nondistended, normoactive bowel sounds,    no masses, no hepatosplenomegaly  Genitalia:    OB/GYN  Rectal:    Not performed due to age<40 and no related complaints  Extremities:   No clubbing, cyanosis or edema  Pulses:   2+ and symmetric all extremities  Skin:   Skin color, texture, turgor normal, no rashes or lesions  Lymph nodes:   Cervical, supraclavicular, and axillary nodes normal  Neurologic:   CNII-XII intact, normal strength, sensation and gait          Psych:   Normal mood, affect, hygiene and grooming.  Assessment & Plan:  Routine general medical examination at a health care facility  Corn of foot - Plan: Ambulatory referral to Podiatry  Lactating mother  She is here for a CPE.  She is breast-feeding and plans to do so for 1 year as she has done in the past with her other children.  Vaginal delivery 3 weeks ago.  Reports doing well and has an OB/GYN follow-up next week.  Her daughter is with her. Declines labs. Preventive health care reviewed.  Recommend regular dental and eye exams. Counseling on healthy lifestyle  including diet and exercise. Immunizations up-to-date except for COVID booster.  She reports having Covid in January and will hold off on her booster. Referral to podiatry due to corn on her right great toe.

## 2020-05-14 ENCOUNTER — Ambulatory Visit: Payer: Managed Care, Other (non HMO) | Admitting: Family Medicine

## 2020-05-14 ENCOUNTER — Encounter: Payer: Self-pay | Admitting: Family Medicine

## 2020-05-14 ENCOUNTER — Other Ambulatory Visit: Payer: Self-pay

## 2020-05-14 VITALS — BP 110/64 | HR 72 | Ht 67.25 in | Wt 219.6 lb

## 2020-05-14 DIAGNOSIS — L84 Corns and callosities: Secondary | ICD-10-CM

## 2020-05-14 DIAGNOSIS — Z0000 Encounter for general adult medical examination without abnormal findings: Secondary | ICD-10-CM

## 2020-07-02 ENCOUNTER — Encounter: Payer: Self-pay | Admitting: Internal Medicine

## 2020-11-02 ENCOUNTER — Other Ambulatory Visit: Payer: Self-pay

## 2020-11-02 ENCOUNTER — Observation Stay (HOSPITAL_COMMUNITY)
Admission: EM | Admit: 2020-11-02 | Discharge: 2020-11-03 | Disposition: A | Discharge status: Home / Self Care | Payer: Managed Care, Other (non HMO) | Attending: Surgery | Admitting: Surgery

## 2020-11-02 ENCOUNTER — Encounter (HOSPITAL_COMMUNITY)
Admission: EM | Disposition: A | Discharge status: Home / Self Care | Payer: Self-pay | Source: Home / Self Care | Attending: Emergency Medicine

## 2020-11-02 ENCOUNTER — Encounter: Payer: Self-pay | Admitting: Family Medicine

## 2020-11-02 ENCOUNTER — Emergency Department (HOSPITAL_COMMUNITY): Payer: Managed Care, Other (non HMO) | Admitting: Anesthesiology

## 2020-11-02 ENCOUNTER — Emergency Department (HOSPITAL_COMMUNITY): Payer: Managed Care, Other (non HMO)

## 2020-11-02 ENCOUNTER — Encounter (HOSPITAL_COMMUNITY): Payer: Self-pay

## 2020-11-02 ENCOUNTER — Ambulatory Visit: Payer: Managed Care, Other (non HMO) | Admitting: Family Medicine

## 2020-11-02 ENCOUNTER — Telehealth: Payer: Self-pay | Admitting: Family Medicine

## 2020-11-02 VITALS — BP 128/72 | HR 91 | Temp 98.6°F | Wt 211.2 lb

## 2020-11-02 DIAGNOSIS — Z87891 Personal history of nicotine dependence: Secondary | ICD-10-CM | POA: Insufficient documentation

## 2020-11-02 DIAGNOSIS — Z20822 Contact with and (suspected) exposure to covid-19: Secondary | ICD-10-CM | POA: Diagnosis not present

## 2020-11-02 DIAGNOSIS — R1031 Right lower quadrant pain: Secondary | ICD-10-CM | POA: Diagnosis present

## 2020-11-02 DIAGNOSIS — R1033 Periumbilical pain: Secondary | ICD-10-CM | POA: Diagnosis not present

## 2020-11-02 DIAGNOSIS — K3580 Unspecified acute appendicitis: Secondary | ICD-10-CM | POA: Diagnosis not present

## 2020-11-02 DIAGNOSIS — R10823 Right lower quadrant rebound abdominal tenderness: Secondary | ICD-10-CM | POA: Diagnosis not present

## 2020-11-02 DIAGNOSIS — Z9049 Acquired absence of other specified parts of digestive tract: Secondary | ICD-10-CM

## 2020-11-02 HISTORY — PX: LAPAROSCOPIC APPENDECTOMY: SHX408

## 2020-11-02 LAB — COMPREHENSIVE METABOLIC PANEL
ALT: 55 U/L — ABNORMAL HIGH (ref 0–44)
AST: 39 U/L (ref 15–41)
Albumin: 4 g/dL (ref 3.5–5.0)
Alkaline Phosphatase: 105 U/L (ref 38–126)
Anion gap: 11 (ref 5–15)
BUN: 8 mg/dL (ref 6–20)
CO2: 25 mmol/L (ref 22–32)
Calcium: 9.3 mg/dL (ref 8.9–10.3)
Chloride: 99 mmol/L (ref 98–111)
Creatinine, Ser: 0.7 mg/dL (ref 0.44–1.00)
GFR, Estimated: >60 mL/min (ref >60)
Glucose, Bld: 92 mg/dL (ref 70–99)
Potassium: 3.6 mmol/L (ref 3.5–5.1)
Sodium: 135 mmol/L (ref 135–145)
Total Bilirubin: 0.9 mg/dL (ref 0.3–1.2)
Total Protein: 7.3 g/dL (ref 6.5–8.1)

## 2020-11-02 LAB — POCT URINALYSIS DIP (CLINITEK)
Bilirubin, UA: NEGATIVE
Glucose, UA: NEGATIVE mg/dL
Leukocytes, UA: NEGATIVE
Nitrite, UA: NEGATIVE
POC PROTEIN,UA: NEGATIVE
Spec Grav, UA: 1.015 (ref 1.010–1.025)
Urobilinogen, UA: 0.2 E.U./dL
pH, UA: 6 (ref 5.0–8.0)

## 2020-11-02 LAB — CBC WITH DIFFERENTIAL/PLATELET
Abs Immature Granulocytes: 0.06 10*3/uL (ref 0.00–0.07)
Basophils Absolute: 0 10*3/uL (ref 0.0–0.1)
Basophils Relative: 0 %
Eosinophils Absolute: 0 10*3/uL (ref 0.0–0.5)
Eosinophils Relative: 0 %
HCT: 42.8 % (ref 36.0–46.0)
Hemoglobin: 14.2 g/dL (ref 12.0–15.0)
Immature Granulocytes: 0 %
Lymphocytes Relative: 6 %
Lymphs Abs: 0.9 10*3/uL (ref 0.7–4.0)
MCH: 29.3 pg (ref 26.0–34.0)
MCHC: 33.2 g/dL (ref 30.0–36.0)
MCV: 88.2 fL (ref 80.0–100.0)
Monocytes Absolute: 1 10*3/uL (ref 0.1–1.0)
Monocytes Relative: 6 %
Neutro Abs: 13.7 10*3/uL — ABNORMAL HIGH (ref 1.7–7.7)
Neutrophils Relative %: 88 %
Platelets: 245 10*3/uL (ref 150–400)
RBC: 4.85 MIL/uL (ref 3.87–5.11)
RDW: 12.9 % (ref 11.5–15.5)
WBC: 15.7 10*3/uL — ABNORMAL HIGH (ref 4.0–10.5)
nRBC: 0 % (ref 0.0–0.2)

## 2020-11-02 LAB — I-STAT BETA HCG BLOOD, ED (MC, WL, AP ONLY): I-stat hCG, quantitative: 11.4 m[IU]/mL — ABNORMAL HIGH (ref <5)

## 2020-11-02 LAB — RESP PANEL BY RT-PCR (FLU A&B, COVID) ARPGX2
Influenza A by PCR: NEGATIVE
Influenza B by PCR: NEGATIVE
SARS Coronavirus 2 by RT PCR: NEGATIVE

## 2020-11-02 LAB — LIPASE, BLOOD: Lipase: 29 U/L (ref 11–51)

## 2020-11-02 LAB — POCT URINE PREGNANCY: Preg Test, Ur: NEGATIVE

## 2020-11-02 SURGERY — APPENDECTOMY, LAPAROSCOPIC
Anesthesia: General

## 2020-11-02 MED ORDER — SODIUM CHLORIDE 0.9 % IV SOLN
2.0000 g | INTRAVENOUS | Status: AC
  Administered 2020-11-02: 2 g via INTRAVENOUS
  Filled 2020-11-02: qty 20

## 2020-11-02 MED ORDER — PROPOFOL 10 MG/ML IV BOLUS
INTRAVENOUS | Status: AC
  Filled 2020-11-02: qty 20

## 2020-11-02 MED ORDER — METRONIDAZOLE 500 MG/100ML IV SOLN
500.0000 mg | INTRAVENOUS | Status: AC
  Administered 2020-11-02: 500 mg via INTRAVENOUS
  Filled 2020-11-02: qty 100

## 2020-11-02 MED ORDER — BUPIVACAINE-EPINEPHRINE 0.25% -1:200000 IJ SOLN
INTRAMUSCULAR | Status: DC | PRN
  Administered 2020-11-02: 17 mL

## 2020-11-02 MED ORDER — METRONIDAZOLE 500 MG/100ML IV SOLN: 500.0000 mg | Freq: Once | INTRAVENOUS | Status: DC

## 2020-11-02 MED ORDER — SODIUM CHLORIDE 0.9 % IV BOLUS
1000.0000 mL | Freq: Once | INTRAVENOUS | Status: AC
  Administered 2020-11-02: 1000 mL via INTRAVENOUS

## 2020-11-02 MED ORDER — ROCURONIUM BROMIDE 100 MG/10ML IV SOLN
INTRAVENOUS | Status: DC | PRN
  Administered 2020-11-02: 50 mg via INTRAVENOUS

## 2020-11-02 MED ORDER — SUGAMMADEX SODIUM 200 MG/2ML IV SOLN
INTRAVENOUS | Status: DC | PRN
  Administered 2020-11-02: 200 mg via INTRAVENOUS

## 2020-11-02 MED ORDER — ONDANSETRON HCL 4 MG/2ML IJ SOLN
4.0000 mg | Freq: Once | INTRAMUSCULAR | Status: AC
  Administered 2020-11-02: 4 mg via INTRAVENOUS
  Filled 2020-11-02: qty 2

## 2020-11-02 MED ORDER — SODIUM CHLORIDE 0.9 % IR SOLN
Status: DC | PRN
  Administered 2020-11-02: 1000 mL

## 2020-11-02 MED ORDER — IOHEXOL 350 MG/ML SOLN
75.0000 mL | Freq: Once | INTRAVENOUS | Status: AC | PRN
  Administered 2020-11-02: 75 mL via INTRAVENOUS

## 2020-11-02 MED ORDER — DEXAMETHASONE SODIUM PHOSPHATE 10 MG/ML IJ SOLN
INTRAMUSCULAR | Status: DC | PRN
  Administered 2020-11-02: 10 mg via INTRAVENOUS

## 2020-11-02 MED ORDER — DEXAMETHASONE SODIUM PHOSPHATE 10 MG/ML IJ SOLN
INTRAMUSCULAR | Status: AC
  Filled 2020-11-02: qty 1

## 2020-11-02 MED ORDER — LIDOCAINE HCL (CARDIAC) PF 100 MG/5ML IV SOSY
PREFILLED_SYRINGE | INTRAVENOUS | Status: DC | PRN
  Administered 2020-11-02: 40 mg via INTRATRACHEAL

## 2020-11-02 MED ORDER — SUCCINYLCHOLINE CHLORIDE 200 MG/10ML IV SOSY
PREFILLED_SYRINGE | INTRAVENOUS | Status: AC
  Filled 2020-11-02: qty 10

## 2020-11-02 MED ORDER — ARTIFICIAL TEARS OPHTHALMIC OINT
TOPICAL_OINTMENT | OPHTHALMIC | Status: AC
  Filled 2020-11-02: qty 3.5

## 2020-11-02 MED ORDER — FENTANYL CITRATE (PF) 250 MCG/5ML IJ SOLN
INTRAMUSCULAR | Status: AC
  Filled 2020-11-02: qty 5

## 2020-11-02 MED ORDER — ROCURONIUM BROMIDE 10 MG/ML (PF) SYRINGE
PREFILLED_SYRINGE | INTRAVENOUS | Status: AC
  Filled 2020-11-02: qty 10

## 2020-11-02 MED ORDER — BUPIVACAINE-EPINEPHRINE (PF) 0.25% -1:200000 IJ SOLN
INTRAMUSCULAR | Status: AC
  Filled 2020-11-02: qty 30

## 2020-11-02 MED ORDER — PROPOFOL 10 MG/ML IV BOLUS
INTRAVENOUS | Status: DC | PRN
  Administered 2020-11-02: 200 mg via INTRAVENOUS

## 2020-11-02 MED ORDER — ONDANSETRON HCL 4 MG/2ML IJ SOLN
INTRAMUSCULAR | Status: DC | PRN
  Administered 2020-11-02: 4 mg via INTRAVENOUS

## 2020-11-02 MED ORDER — MIDAZOLAM HCL 2 MG/2ML IJ SOLN
INTRAMUSCULAR | Status: AC
  Filled 2020-11-02: qty 2

## 2020-11-02 MED ORDER — EPHEDRINE 5 MG/ML INJ
INTRAVENOUS | Status: AC
  Filled 2020-11-02: qty 5

## 2020-11-02 MED ORDER — LACTATED RINGERS IV SOLN: INTRAVENOUS | Status: DC | PRN

## 2020-11-02 MED ORDER — MORPHINE SULFATE (PF) 4 MG/ML IV SOLN
4.0000 mg | Freq: Once | INTRAVENOUS | Status: AC
  Administered 2020-11-02: 4 mg via INTRAVENOUS
  Filled 2020-11-02: qty 1

## 2020-11-02 MED ORDER — ONDANSETRON HCL 4 MG/2ML IJ SOLN
INTRAMUSCULAR | Status: AC
  Filled 2020-11-02: qty 2

## 2020-11-02 MED ORDER — LIDOCAINE 2% (20 MG/ML) 5 ML SYRINGE
INTRAMUSCULAR | Status: AC
  Filled 2020-11-02: qty 5

## 2020-11-02 MED ORDER — SODIUM CHLORIDE 0.9 % IV SOLN: 2.0000 g | Freq: Once | INTRAVENOUS | Status: DC

## 2020-11-02 MED ORDER — FENTANYL CITRATE (PF) 250 MCG/5ML IJ SOLN
INTRAMUSCULAR | Status: DC | PRN
  Administered 2020-11-02: 100 ug via INTRAVENOUS
  Administered 2020-11-02 (×3): 50 ug via INTRAVENOUS

## 2020-11-02 MED ORDER — ACETAMINOPHEN 10 MG/ML IV SOLN
INTRAVENOUS | Status: AC
  Filled 2020-11-02: qty 100

## 2020-11-02 MED ORDER — MIDAZOLAM HCL 5 MG/5ML IJ SOLN
INTRAMUSCULAR | Status: DC | PRN
  Administered 2020-11-02: 2 mg via INTRAVENOUS

## 2020-11-02 MED ORDER — ACETAMINOPHEN 10 MG/ML IV SOLN
INTRAVENOUS | Status: DC | PRN
  Administered 2020-11-02: 1000 mg via INTRAVENOUS

## 2020-11-02 MED ORDER — 0.9 % SODIUM CHLORIDE (POUR BTL) OPTIME
TOPICAL | Status: DC | PRN
  Administered 2020-11-02: 1000 mL

## 2020-11-02 MED ORDER — PHENYLEPHRINE 40 MCG/ML (10ML) SYRINGE FOR IV PUSH (FOR BLOOD PRESSURE SUPPORT)
PREFILLED_SYRINGE | INTRAVENOUS | Status: AC
  Filled 2020-11-02: qty 10

## 2020-11-02 MED ORDER — SUCCINYLCHOLINE CHLORIDE 200 MG/10ML IV SOSY
PREFILLED_SYRINGE | INTRAVENOUS | Status: DC | PRN
  Administered 2020-11-02: 100 mg via INTRAVENOUS

## 2020-11-02 SURGICAL SUPPLY — 42 items
APPLIER CLIP 5 13 M/L LIGAMAX5 (MISCELLANEOUS) ×2
BAG COUNTER SPONGE SURGICOUNT (BAG) ×2 IMPLANT
BLADE CLIPPER SURG (BLADE) IMPLANT
CANISTER SUCT 3000ML PPV (MISCELLANEOUS) ×2 IMPLANT
CHLORAPREP W/TINT 26 (MISCELLANEOUS) ×2 IMPLANT
CLIP APPLIE 5 13 M/L LIGAMAX5 (MISCELLANEOUS) ×1 IMPLANT
COVER SURGICAL LIGHT HANDLE (MISCELLANEOUS) ×2 IMPLANT
CUTTER FLEX LINEAR 45M (STAPLE) ×2 IMPLANT
DERMABOND ADHESIVE PROPEN (GAUZE/BANDAGES/DRESSINGS) ×3
DERMABOND ADVANCED (GAUZE/BANDAGES/DRESSINGS) ×1
DERMABOND ADVANCED .7 DNX12 (GAUZE/BANDAGES/DRESSINGS) ×1 IMPLANT
DERMABOND ADVANCED .7 DNX6 (GAUZE/BANDAGES/DRESSINGS) ×3 IMPLANT
ELECT REM PT RETURN 9FT ADLT (ELECTROSURGICAL) ×2
ELECTRODE REM PT RTRN 9FT ADLT (ELECTROSURGICAL) ×1 IMPLANT
GLOVE SURG ENC MOIS LTX SZ7.5 (GLOVE) ×2 IMPLANT
GLOVE SURG UNDER LTX SZ8 (GLOVE) ×2 IMPLANT
GOWN STRL REUS W/ TWL LRG LVL3 (GOWN DISPOSABLE) ×2 IMPLANT
GOWN STRL REUS W/ TWL XL LVL3 (GOWN DISPOSABLE) ×1 IMPLANT
GOWN STRL REUS W/TWL LRG LVL3 (GOWN DISPOSABLE) ×4
GOWN STRL REUS W/TWL XL LVL3 (GOWN DISPOSABLE) ×2
KIT BASIN OR (CUSTOM PROCEDURE TRAY) ×2 IMPLANT
KIT TURNOVER KIT B (KITS) ×2 IMPLANT
NS IRRIG 1000ML POUR BTL (IV SOLUTION) ×2 IMPLANT
PAD ARMBOARD 7.5X6 YLW CONV (MISCELLANEOUS) ×4 IMPLANT
PENCIL SMOKE EVACUATOR (MISCELLANEOUS) ×2 IMPLANT
POUCH SPECIMEN RETRIEVAL 10MM (ENDOMECHANICALS) ×2 IMPLANT
RELOAD 45 VASCULAR/THIN (ENDOMECHANICALS) IMPLANT
RELOAD STAPLE TA45 3.5 REG BLU (ENDOMECHANICALS) ×2 IMPLANT
SCISSORS LAP 5X35 DISP (ENDOMECHANICALS) IMPLANT
SET IRRIG TUBING LAPAROSCOPIC (IRRIGATION / IRRIGATOR) ×2 IMPLANT
SET TUBE SMOKE EVAC HIGH FLOW (TUBING) ×2 IMPLANT
SHEARS HARMONIC ACE PLUS 36CM (ENDOMECHANICALS) ×2 IMPLANT
SPECIMEN JAR SMALL (MISCELLANEOUS) ×2 IMPLANT
SUT MNCRL AB 4-0 PS2 18 (SUTURE) ×2 IMPLANT
TOWEL GREEN STERILE (TOWEL DISPOSABLE) ×2 IMPLANT
TOWEL GREEN STERILE FF (TOWEL DISPOSABLE) ×2 IMPLANT
TRAY FOLEY W/BAG SLVR 16FR (SET/KITS/TRAYS/PACK) ×2
TRAY FOLEY W/BAG SLVR 16FR ST (SET/KITS/TRAYS/PACK) ×1 IMPLANT
TRAY LAPAROSCOPIC MC (CUSTOM PROCEDURE TRAY) ×2 IMPLANT
TROCAR ADV FIXATION 5X100MM (TROCAR) ×4 IMPLANT
TROCAR XCEL BLUNT TIP 100MML (ENDOMECHANICALS) ×2 IMPLANT
WATER STERILE IRR 1000ML POUR (IV SOLUTION) ×2 IMPLANT

## 2020-11-02 NOTE — H&P (Signed)
CC: Right lower quadrant pain x1-2 days  Requesting provider: Domenic Moras [facility]  HPI: Misty Wright is an 30 y.o. female with no significant medical hx presented to the emergency department today with a 1 to 2-day history of worsening right lower quadrant abdominal pain.  She states she has never had this kind of pain before.  The pain has been described as sharp/deep severe cramp.  Unremitting. Nonradiating. No clear aggravating/alleviating factors.  Associated nausea.  No change in bowel habits.   She had a few sips of water at 3 pm, no food since noon today  Past Medical History:  Diagnosis Date   Postpartum depression    Pyelonephritis     Past Surgical History:  Procedure Laterality Date   arm surgery Left    age 56   WISDOM TOOTH EXTRACTION    She denies any prior abdominal surgical history  Family History  Problem Relation Age of Onset   Depression Mother    Mental illness Mother    Cancer Maternal Grandmother 55       colon    Early death Maternal Grandmother    Alcohol abuse Maternal Grandfather    Heart disease Brother    AAA (abdominal aortic aneurysm) Paternal Grandmother    Dementia Paternal Grandmother     Social:  reports that she quit smoking about 7 years ago. Her smoking use included cigarettes. She has never used smokeless tobacco. She reports that she does not currently use alcohol. She reports that she does not use drugs.  Allergies:  Allergies  Allergen Reactions   Mold Extract [Trichophyton] Other (See Comments)    Caused sinus infection.    Medications: I have reviewed the patient's current medications.  Results for orders placed or performed during the hospital encounter of 11/02/20 (from the past 48 hour(s))  Comprehensive metabolic panel     Status: Abnormal   Collection Time: 11/02/20  5:39 PM  Result Value Ref Range   Sodium 135 135 - 145 mmol/L   Potassium 3.6 3.5 - 5.1 mmol/L   Chloride 99 98 - 111 mmol/L   CO2 25 22 - 32  mmol/L   Glucose, Bld 92 70 - 99 mg/dL    Comment: Glucose reference range applies only to samples taken after fasting for at least 8 hours.   BUN 8 6 - 20 mg/dL   Creatinine, Ser 0.70 0.44 - 1.00 mg/dL   Calcium 9.3 8.9 - 10.3 mg/dL   Total Protein 7.3 6.5 - 8.1 g/dL   Albumin 4.0 3.5 - 5.0 g/dL   AST 39 15 - 41 U/L   ALT 55 (H) 0 - 44 U/L   Alkaline Phosphatase 105 38 - 126 U/L   Total Bilirubin 0.9 0.3 - 1.2 mg/dL   GFR, Estimated >60 >60 mL/min    Comment: (NOTE) Calculated using the CKD-EPI Creatinine Equation (2021)    Anion gap 11 5 - 15    Comment: Performed at [facility] [address] [place], [place] [address]  CBC with Differential     Status: Abnormal   Collection Time: 11/02/20  5:39 PM  Result Value Ref Range   WBC 15.7 (H) 4.0 - 10.5 K/uL   RBC 4.85 3.87 - 5.11 MIL/uL   Hemoglobin 14.2 12.0 - 15.0 g/dL   HCT 42.8 36.0 - 46.0 %   MCV 88.2 80.0 - 100.0 fL   MCH 29.3 26.0 - 34.0 pg   MCHC 33.2 30.0 -  36.0 g/dL   RDW 12.9 11.5 - 15.5 %   Platelets 245 150 - 400 K/uL   nRBC 0.0 0.0 - 0.2 %   Neutrophils Relative % 88 %   Neutro Abs 13.7 (H) 1.7 - 7.7 K/uL   Lymphocytes Relative 6 %   Lymphs Abs 0.9 0.7 - 4.0 K/uL   Monocytes Relative 6 %   Monocytes Absolute 1.0 0.1 - 1.0 K/uL   Eosinophils Relative 0 %   Eosinophils Absolute 0.0 0.0 - 0.5 K/uL   Basophils Relative 0 %   Basophils Absolute 0.0 0.0 - 0.1 K/uL   Immature Granulocytes 0 %   Abs Immature Granulocytes 0.06 0.00 - 0.07 K/uL    Comment: Performed at [facility] [address], [place], [place] [address]  Lipase, blood     Status: None   Collection Time: 11/02/20  5:39 PM  Result Value Ref Range   Lipase 29 11 - 51 U/L    Comment: Performed at [facility] [address] [place], [place] [address]  I-Stat beta hCG blood, ED     Status: Abnormal   Collection Time: 11/02/20  6:19 PM  Result Value Ref Range   I-stat hCG, quantitative 11.4 (H) <5 mIU/mL   Comment 3             Comment:   GEST. AGE      CONC.  (mIU/mL)   <=1 WEEK        5 - 50     2 WEEKS       50 - 500     3 WEEKS       100 - 10,000     4 WEEKS     1,000 - 30,000        FEMALE AND NON-PREGNANT FEMALE:     LESS THAN 5 mIU/mL   Resp Panel by RT-PCR (Flu A&B, Covid) Nasopharyngeal Swab     Status: None   Collection Time: 11/02/20  7:09 PM   Specimen: Nasopharyngeal Swab; Nasopharyngeal(NP) swabs in vial transport medium  Result Value Ref Range   SARS Coronavirus 2 by RT PCR NEGATIVE NEGATIVE    Comment: (NOTE) SARS-CoV-2 target nucleic acids are NOT DETECTED.  The SARS-CoV-2 RNA is generally detectable in upper respiratory specimens during the acute phase of infection. The lowest concentration of SARS-CoV-2 viral copies this assay can detect is 138 copies/mL. A negative result does not preclude SARS-Cov-2 infection and should not be used as the sole basis for treatment or other patient management decisions. A negative result may occur with  improper specimen collection/handling, submission of specimen other than nasopharyngeal swab, presence of viral mutation(s) within the areas targeted by this assay, and inadequate number of viral copies(<138 copies/mL). A negative result must be combined with clinical observations, patient history, and epidemiological information. The expected result is Negative.  Fact Sheet for Patients:  EntrepreneurPulse.com.au  Fact Sheet for Healthcare Providers:  IncredibleEmployment.be  This test is no t yet approved or cleared by the [place] FDA and  has been authorized for detection and/or diagnosis of SARS-CoV-2 by FDA under an Emergency Use Authorization (EUA). This EUA will remain  in effect (meaning this test can be used) for the duration of the COVID-19 declaration under Section 564(b)(1) of the Act, 21 U.S.C.section 360bbb-3(b)(1), unless the authorization is terminated  or revoked sooner.        Influenza A by PCR NEGATIVE NEGATIVE   Influenza B by PCR  NEGATIVE NEGATIVE    Comment: (NOTE) The Xpert Xpress SARS-CoV-2/FLU/RSV plus assay is intended as an aid in the diagnosis of influenza from Nasopharyngeal swab specimens and should not be used as a sole basis for treatment. Nasal washings and aspirates are unacceptable for Xpert Xpress SARS-CoV-2/FLU/RSV testing.  Fact Sheet for Patients: EntrepreneurPulse.com.au  Fact Sheet for Healthcare Providers: IncredibleEmployment.be  This test is not yet approved or cleared by the [place] FDA and has been authorized for detection and/or diagnosis of SARS-CoV-2 by FDA under an Emergency Use Authorization (EUA). This EUA will remain in effect (meaning this test can be used) for the duration of the COVID-19 declaration under Section 564(b)(1) of the Act, 21 U.S.C. section 360bbb-3(b)(1), unless the authorization is terminated or revoked.  Performed at [facility], [place] [address] [place], [place] [address]     CT ABDOMEN PELVIS W CONTRAST  Result Date: 11/02/2020 CLINICAL DATA:  Right lower quadrant abdominal pain. EXAM: CT ABDOMEN AND PELVIS WITH CONTRAST TECHNIQUE: Multidetector CT imaging of the abdomen and pelvis was performed using the standard protocol following bolus administration of intravenous contrast. CONTRAST:  65m OMNIPAQUE IOHEXOL 350 MG/ML SOLN COMPARISON:  None. FINDINGS: Lower chest: No acute abnormality. Hepatobiliary: No focal liver abnormality is seen. No gallstones, gallbladder wall thickening, or biliary dilatation. Pancreas: Unremarkable. No pancreatic ductal dilatation or surrounding inflammatory changes. Spleen: Normal in size without focal abnormality. Adrenals/Urinary Tract: Adrenal glands are unremarkable. Kidneys are normal, without renal calculi, focal lesion, or hydronephrosis. Bladder is unremarkable. Stomach/Bowel: The appendix is dilated and fluid-filled  measuring up to the 12 mm in diameter. There is marked surrounding inflammatory stranding and fluid. There is no appendicoliths, perforation or abscess at this time. Additionally, there is wall thickening of the cecum there is no bowel obstruction. Small bowel and stomach are within normal limits. Vascular/Lymphatic: No significant vascular findings are present. No enlarged abdominal or pelvic lymph nodes. Reproductive: An IUD is noted in the uterus. No adnexal masses are identified. Other: There is a small amount of free fluid in the pelvis and right lower quadrant. There is a small fat containing umbilical hernia. Musculoskeletal: No acute or significant osseous findings. IMPRESSION: 1. Acute appendicitis. No evidence for perforation or abscess. Small amount of free fluid. Electronically Signed   By: ARonney AstersM.D.   On: 11/02/2020 20:07    ROS - all of the below systems have been reviewed with the patient and positives are indicated with bold text General: chills, fever or night sweats Eyes: blurry vision or double vision ENT: epistaxis or sore throat Allergy/Immunology: itchy/watery eyes or nasal congestion Hematologic/Lymphatic: bleeding problems, blood clots or swollen lymph nodes Endocrine: temperature intolerance or unexpected weight changes Breast: new or changing breast lumps or nipple discharge Resp: cough, shortness of breath, or wheezing CV: chest pain or dyspnea on exertion GI: as per HPI GU: dysuria, trouble voiding, or hematuria MSK: joint pain or joint stiffness Neuro: TIA or stroke symptoms Derm: pruritus and skin lesion changes Psych: anxiety and depression  PE Blood pressure 120/90, pulse 91, temperature 100.3 F (37.9 C), resp. rate (!) 27, SpO2 100 %, currently breastfeeding. Constitutional: NAD; conversant; no deformities Eyes: Moist conjunctiva; no lid lag; anicteric; PERRL Neck: Trachea midline; no thyromegaly Lungs: Normal respiratory effort; no tactile  fremitus CV: RRR; no palpable thrills; no pitting edema GI: Abd soft, focally tender to deep palpation RLQ; no rebound nor guarding; no palpable hepatosplenomegaly MSK: Normal range of motion of extremities; no clubbing/cyanosis Psychiatric:  Appropriate affect; alert and oriented x3 Lymphatic: No palpable cervical or axillary lymphadenopathy  Results for orders placed or performed during the hospital encounter of 11/02/20 (from the past 48 hour(s))  Comprehensive metabolic panel     Status: Abnormal   Collection Time: 11/02/20  5:39 PM  Result Value Ref Range   Sodium 135 135 - 145 mmol/L   Potassium 3.6 3.5 - 5.1 mmol/L   Chloride 99 98 - 111 mmol/L   CO2 25 22 - 32 mmol/L   Glucose, Bld 92 70 - 99 mg/dL    Comment: Glucose reference range applies only to samples taken after fasting for at least 8 hours.   BUN 8 6 - 20 mg/dL   Creatinine, Ser 0.70 0.44 - 1.00 mg/dL   Calcium 9.3 8.9 - 10.3 mg/dL   Total Protein 7.3 6.5 - 8.1 g/dL   Albumin 4.0 3.5 - 5.0 g/dL   AST 39 15 - 41 U/L   ALT 55 (H) 0 - 44 U/L   Alkaline Phosphatase 105 38 - 126 U/L   Total Bilirubin 0.9 0.3 - 1.2 mg/dL   GFR, Estimated >60 >60 mL/min    Comment: (NOTE) Calculated using the CKD-EPI Creatinine Equation (2021)    Anion gap 11 5 - 15    Comment: Performed at [facility] [address] [place], [place] [address]  CBC with Differential     Status: Abnormal   Collection Time: 11/02/20  5:39 PM  Result Value Ref Range   WBC 15.7 (H) 4.0 - 10.5 K/uL   RBC 4.85 3.87 - 5.11 MIL/uL   Hemoglobin 14.2 12.0 - 15.0 g/dL   HCT 42.8 36.0 - 46.0 %   MCV 88.2 80.0 - 100.0 fL   MCH 29.3 26.0 - 34.0 pg   MCHC 33.2 30.0 - 36.0 g/dL   RDW 12.9 11.5 - 15.5 %   Platelets 245 150 - 400 K/uL   nRBC 0.0 0.0 - 0.2 %   Neutrophils Relative % 88 %   Neutro Abs 13.7 (H) 1.7 - 7.7 K/uL   Lymphocytes Relative 6 %   Lymphs Abs 0.9 0.7 - 4.0 K/uL   Monocytes Relative 6 %   Monocytes Absolute 1.0 0.1 - 1.0 K/uL    Eosinophils Relative 0 %   Eosinophils Absolute 0.0 0.0 - 0.5 K/uL   Basophils Relative 0 %   Basophils Absolute 0.0 0.0 - 0.1 K/uL   Immature Granulocytes 0 %   Abs Immature Granulocytes 0.06 0.00 - 0.07 K/uL    Comment: Performed at [facility] [address], [place], [place] [address]  Lipase, blood     Status: None   Collection Time: 11/02/20  5:39 PM  Result Value Ref Range   Lipase 29 11 - 51 U/L    Comment: Performed at [facility] [address] [place], [place] [address]  I-Stat beta hCG blood, ED     Status: Abnormal   Collection Time: 11/02/20  6:19 PM  Result Value Ref Range   I-stat hCG, quantitative 11.4 (H) <5 mIU/mL   Comment 3            Comment:   GEST. AGE      CONC.  (mIU/mL)   <=1 WEEK        5 - 50     2 WEEKS       50 - 500     3 WEEKS  100 - 10,000     4 WEEKS     1,000 - 30,000        FEMALE AND NON-PREGNANT FEMALE:     LESS THAN 5 mIU/mL   Resp Panel by RT-PCR (Flu A&B, Covid) Nasopharyngeal Swab     Status: None   Collection Time: 11/02/20  7:09 PM   Specimen: Nasopharyngeal Swab; Nasopharyngeal(NP) swabs in vial transport medium  Result Value Ref Range   SARS Coronavirus 2 by RT PCR NEGATIVE NEGATIVE    Comment: (NOTE) SARS-CoV-2 target nucleic acids are NOT DETECTED.  The SARS-CoV-2 RNA is generally detectable in upper respiratory specimens during the acute phase of infection. The lowest concentration of SARS-CoV-2 viral copies this assay can detect is 138 copies/mL. A negative result does not preclude SARS-Cov-2 infection and should not be used as the sole basis for treatment or other patient management decisions. A negative result may occur with  improper specimen collection/handling, submission of specimen other than nasopharyngeal swab, presence of viral mutation(s) within the areas targeted by this assay, and inadequate number of viral copies(<138 copies/mL). A negative result must be combined with clinical  observations, patient history, and epidemiological information. The expected result is Negative.  Fact Sheet for Patients:  EntrepreneurPulse.com.au  Fact Sheet for Healthcare Providers:  IncredibleEmployment.be  This test is no t yet approved or cleared by the [place] FDA and  has been authorized for detection and/or diagnosis of SARS-CoV-2 by FDA under an Emergency Use Authorization (EUA). This EUA will remain  in effect (meaning this test can be used) for the duration of the COVID-19 declaration under Section 564(b)(1) of the Act, 21 U.S.C.section 360bbb-3(b)(1), unless the authorization is terminated  or revoked sooner.       Influenza A by PCR NEGATIVE NEGATIVE   Influenza B by PCR NEGATIVE NEGATIVE    Comment: (NOTE) The Xpert Xpress SARS-CoV-2/FLU/RSV plus assay is intended as an aid in the diagnosis of influenza from Nasopharyngeal swab specimens and should not be used as a sole basis for treatment. Nasal washings and aspirates are unacceptable for Xpert Xpress SARS-CoV-2/FLU/RSV testing.  Fact Sheet for Patients: EntrepreneurPulse.com.au  Fact Sheet for Healthcare Providers: IncredibleEmployment.be  This test is not yet approved or cleared by the [place] FDA and has been authorized for detection and/or diagnosis of SARS-CoV-2 by FDA under an Emergency Use Authorization (EUA). This EUA will remain in effect (meaning this test can be used) for the duration of the COVID-19 declaration under Section 564(b)(1) of the Act, 21 U.S.C. section 360bbb-3(b)(1), unless the authorization is terminated or revoked.  Performed at [facility], [place] [address] [place], [place] [address]     CT ABDOMEN PELVIS W CONTRAST  Result Date: 11/02/2020 CLINICAL DATA:  Right lower quadrant abdominal pain. EXAM: CT ABDOMEN AND PELVIS WITH CONTRAST TECHNIQUE: Multidetector CT imaging of the abdomen and  pelvis was performed using the standard protocol following bolus administration of intravenous contrast. CONTRAST:  53m OMNIPAQUE IOHEXOL 350 MG/ML SOLN COMPARISON:  None. FINDINGS: Lower chest: No acute abnormality. Hepatobiliary: No focal liver abnormality is seen. No gallstones, gallbladder wall thickening, or biliary dilatation. Pancreas: Unremarkable. No pancreatic ductal dilatation or surrounding inflammatory changes. Spleen: Normal in size without focal abnormality. Adrenals/Urinary Tract: Adrenal glands are unremarkable. Kidneys are normal, without renal calculi, focal lesion, or hydronephrosis. Bladder is unremarkable. Stomach/Bowel: The appendix is dilated and fluid-filled measuring up to the 12 mm in diameter. There is marked surrounding inflammatory stranding and fluid. There is  no appendicoliths, perforation or abscess at this time. Additionally, there is wall thickening of the cecum there is no bowel obstruction. Small bowel and stomach are within normal limits. Vascular/Lymphatic: No significant vascular findings are present. No enlarged abdominal or pelvic lymph nodes. Reproductive: An IUD is noted in the uterus. No adnexal masses are identified. Other: There is a small amount of free fluid in the pelvis and right lower quadrant. There is a small fat containing umbilical hernia. Musculoskeletal: No acute or significant osseous findings. IMPRESSION: 1. Acute appendicitis. No evidence for perforation or abscess. Small amount of free fluid. Electronically Signed   By: Ronney Asters M.D.   On: 11/02/2020 20:07     A/P: Misty Wright is an 30 y.o. female with acute appendicitis - no evident perforation/abscess on CT  -The anatomy and physiology of the GI tract was reviewed with her. The pathophysiology of appendicitis was discussed as well. -We reviewed options moving forward for treatment, covering IV abx vs surgery. We discussed that with antibiotics alone, there is reasonable success in  managing appendicitis, however, risks of recurrence at 20yr being as high as 40% in some studies. We discussed appendectomy - laparoscopic and potential open techniques. We discussed the material risks (including, but not limited to, pain, bleeding, infection, scarring, need for blood transfusion, damage to surrounding structures- blood vessels/nerves/viscus/organs, damage to bladder, leak from staple line, need for additional procedures, hernia, recurrence although quite low, pneumonia, heart attack, stroke, and in very rare cases, death) benefits and alternatives to surgery were discussed. The patient's questions were answered to her satisfaction, she voiced understanding and elected to proceed with surgery. Additionally, we discussed typical postoperative expectations and the recovery process. -We will plan to proceed to OR tonight baring unforseen issues with OR availability or other emergencies -She has asked I update her husband, ACristie Hem after her procedure  CNadeen Landau MD [facility] Use [email or web address] to contact on call provider

## 2020-11-02 NOTE — Telephone Encounter (Signed)
Pt would like to have return call,   She has been having 3 days of severe abd pain, appt scheduled

## 2020-11-02 NOTE — Anesthesia Preprocedure Evaluation (Addendum)
Anesthesia Evaluation  Patient identified by MRN, date of birth, ID band Patient awake    Reviewed: Allergy & Precautions, NPO status , Patient's Chart, lab work & pertinent test results  History of Anesthesia Complications Negative for: history of anesthetic complications  Airway Mallampati: II  TM Distance: >3 FB Neck ROM: Full    Dental  (+) Dental Advisory Given   Pulmonary former smoker,    Pulmonary exam normal        Cardiovascular negative cardio ROS Normal cardiovascular exam     Neuro/Psych PSYCHIATRIC DISORDERS Depression negative neurological ROS     GI/Hepatic Neg liver ROS,  Appendicitis    Endo/Other   Obesity   Renal/GU negative Renal ROS     Musculoskeletal negative musculoskeletal ROS (+)   Abdominal   Peds  Hematology negative hematology ROS (+)   Anesthesia Other Findings   Reproductive/Obstetrics (+) Breast feeding                             Anesthesia Physical Anesthesia Plan  ASA: 2 and emergent  Anesthesia Plan: General   Post-op Pain Management:    Induction: Intravenous and Rapid sequence  PONV Risk Score and Plan: 4 or greater and Treatment may vary due to age or medical condition, Ondansetron, Midazolam and Dexamethasone  Airway Management Planned: Oral ETT  Additional Equipment: None  Intra-op Plan:   Post-operative Plan: Extubation in OR  Informed Consent: I have reviewed the patients History and Physical, chart, labs and discussed the procedure including the risks, benefits and alternatives for the proposed anesthesia with the patient or authorized representative who has indicated his/her understanding and acceptance.     Dental advisory given  Plan Discussed with: CRNA and Anesthesiologist  Anesthesia Plan Comments:        Anesthesia Quick Evaluation

## 2020-11-02 NOTE — ED Provider Notes (Signed)
[facility] EMERGENCY DEPARTMENT Provider Note   CSN: [id number] Arrival date & time: 11/02/20  1617     History Chief Complaint  Patient presents with   Abdominal Pain    Misty Wright is a 30 y.o. female.  The history is provided by the patient and medical records. No language interpreter was used.  Abdominal Pain  30 year old female presenting for evaluation of abdominal pain.  For the past 2 days patient has noticed progressive worsening pain to her abdomen.  Initially started in her mid abdomen and now progressed towards her right lower quadrant.  Pain is described as a sharp crampy sensation that felt similar to labor pain that she had had in the past.  She also endorsed nausea and decrease in appetite.  She reports subjective fever and chills.  She report normal bowel movement.  She denies any chest pain shortness of breath dysuria vaginal bleeding or vaginal discharge.  She was seen at her PCP office but was sent here due to concerns of potential appendicitis.  She is currently 6 months postpartum and is breast-feeding.  Past Medical History:  Diagnosis Date   Postpartum depression    Pyelonephritis     Patient Active Problem List   Diagnosis Date Noted   SVD (spontaneous vaginal delivery) 04/22/2020   Indication for care in labor or delivery 04/20/2020   Atypical nevi 05/13/2019   Ingrown toenail 08/14/2015   BMI 33.0-33.9,adult 12/07/2014    Past Surgical History:  Procedure Laterality Date   arm surgery Left    age 14   WISDOM TOOTH EXTRACTION       OB History     Gravida  3   Para  3   Term  3   Preterm      AB      Living  3      SAB      IAB      Ectopic      Multiple  0   Live Births  3           Family History  Problem Relation Age of Onset   Depression Mother    Mental illness Mother    Cancer Maternal Grandmother 5       colon    Early death Maternal Grandmother    Alcohol abuse Maternal  Grandfather    Heart disease Brother    AAA (abdominal aortic aneurysm) Paternal Grandmother    Dementia Paternal Grandmother     Social History   Tobacco Use   Smoking status: Former    Types: Cigarettes    Quit date: 12/24/2012    Years since quitting: 7.8   Smokeless tobacco: Never  Substance Use Topics   Alcohol use: Not Currently   Drug use: No    Home Medications Prior to Admission medications   Medication Sig Start Date End Date Taking? Authorizing Provider  Prenatal Vit-Fe Fumarate-FA (PRENATAL MULTIVITAMIN) TABS tablet Take 1 tablet by mouth daily at 12 noon.    [provider]    Allergies    Mold extract [trichophyton]  Review of Systems   Review of Systems  Gastrointestinal:  Positive for abdominal pain.  All other systems reviewed and are negative.  Physical Exam Updated Vital Signs BP (!) 88/62 (BP Location: Right Arm)   Pulse 100   Temp 100.3 F (37.9 C)   Resp (!) 24   SpO2 100%   Physical Exam Vitals and nursing note reviewed.  Constitutional:      General: She is not in acute distress.    Appearance: She is well-developed.     Comments: Patient appears uncomfortable  HENT:     Head: Atraumatic.  Eyes:     Conjunctiva/sclera: Conjunctivae normal.  Cardiovascular:     Rate and Rhythm: Normal rate and regular rhythm.  Pulmonary:     Effort: Pulmonary effort is normal.  Abdominal:     Palpations: Abdomen is soft.     Tenderness: There is abdominal tenderness in the right lower quadrant. There is guarding. There is no rebound. Positive signs include McBurney's sign. Negative signs include Murphy's sign.     Hernia: No hernia is present.  Musculoskeletal:     Cervical back: Neck supple.  Skin:    Findings: No rash.  Neurological:     Mental Status: She is alert.  Psychiatric:        Mood and Affect: Mood normal.    ED Results / Procedures / Treatments   Labs (all labs ordered are listed, but only abnormal results are  displayed) Labs Reviewed  COMPREHENSIVE METABOLIC PANEL - Abnormal; Notable for the following components:      Result Value   ALT 55 (*)    All other components within normal limits  CBC WITH DIFFERENTIAL/PLATELET - Abnormal; Notable for the following components:   WBC 15.7 (*)    Neutro Abs 13.7 (*)    All other components within normal limits  I-STAT BETA HCG BLOOD, ED ([facility], [facility], [facility] ONLY) - Abnormal; Notable for the following components:   I-stat hCG, quantitative 11.4 (*)    All other components within normal limits  RESP PANEL BY RT-PCR (FLU A&B, COVID) ARPGX2  LIPASE, BLOOD    EKG None  Radiology CT ABDOMEN PELVIS W CONTRAST  Result Date: 11/02/2020 CLINICAL DATA:  Right lower quadrant abdominal pain. EXAM: CT ABDOMEN AND PELVIS WITH CONTRAST TECHNIQUE: Multidetector CT imaging of the abdomen and pelvis was performed using the standard protocol following bolus administration of intravenous contrast. CONTRAST:  71m OMNIPAQUE IOHEXOL 350 MG/ML SOLN COMPARISON:  None. FINDINGS: Lower chest: No acute abnormality. Hepatobiliary: No focal liver abnormality is seen. No gallstones, gallbladder wall thickening, or biliary dilatation. Pancreas: Unremarkable. No pancreatic ductal dilatation or surrounding inflammatory changes. Spleen: Normal in size without focal abnormality. Adrenals/Urinary Tract: Adrenal glands are unremarkable. Kidneys are normal, without renal calculi, focal lesion, or hydronephrosis. Bladder is unremarkable. Stomach/Bowel: The appendix is dilated and fluid-filled measuring up to the 12 mm in diameter. There is marked surrounding inflammatory stranding and fluid. There is no appendicoliths, perforation or abscess at this time. Additionally, there is wall thickening of the cecum there is no bowel obstruction. Small bowel and stomach are within normal limits. Vascular/Lymphatic: No significant vascular findings are present. No enlarged abdominal or pelvic lymph nodes. Reproductive:  An IUD is noted in the uterus. No adnexal masses are identified. Other: There is a small amount of free fluid in the pelvis and right lower quadrant. There is a small fat containing umbilical hernia. Musculoskeletal: No acute or significant osseous findings. IMPRESSION: 1. Acute appendicitis. No evidence for perforation or abscess. Small amount of free fluid. Electronically Signed   By: ARonney AstersM.D.   On: 11/02/2020 20:07    Procedures Procedures   Medications Ordered in ED Medications  ondansetron (ZOFRAN) injection 4 mg (4 mg Intravenous Given 11/02/20 1844)  morphine 4 MG/ML injection 4 mg (4 mg Intravenous Given 11/02/20 1844)  sodium chloride  0.9 % bolus 1,000 mL (1,000 mLs Intravenous Other (enter comment in med admin window) 11/02/20 1914)    ED Course  I have reviewed the triage vital signs and the nursing notes.  Pertinent labs & imaging results that were available during my care of the patient were reviewed by me and considered in my medical decision making (see chart for details).  Clinical Course as of 11/02/20 2051  Fri Nov 02, 2020  1851 Patient was given morphine, started hyperventilating and feeling light headed.  I hung a liter bolus, called charge to get her moved back.  Moved to recliner.  [EH]    Clinical Course User Index [EH] Lorin Glass, PA-C   MDM Rules/Calculators/A&P                           BP 120/90   Pulse 91   Temp 100.3 F (37.9 C)   Resp (!) 27   SpO2 100%   Final Clinical Impression(s) / ED Diagnoses Final diagnoses:  Acute appendicitis, unspecified acute appendicitis type    Rx / DC Orders ED Discharge Orders     None      7:20 PM Patient here with right lower quad abdominal pain concerning for appendicitis.  Patient is febrile, tachycardic and hypotensive after she received a dose of pain medication.  IV fluid given, labs obtained showed an elevated white count of 15.7.  CT scan ordered.  Will monitor closely  8:52 PM CT  scan demonstrate acute appendicitis without any evidence of perforation or abscess.  Small amount of free fluid was noted.  Patient does have a mildly elevated hCG of 11 but this is likely nonspecific and patient is currently not pregnant.  She has an IUD noted uterus.  Appreciate consultation from on-call general surgeon Dr. Dema Severin who agrees to admit patient.  He request for an antibiotic to be given.  Will order Rocephin and Zithromax and patient is NPO.  Patient made aware of plan and agrees with plan.     Domenic Moras, PA-C 11/02/20 2057    Jeanell Sparrow, DO 11/03/20 0010

## 2020-11-02 NOTE — Op Note (Signed)
Misty Wright [id number]   PRE-OPERATIVE DIAGNOSIS:  Acute appendicitis  POST-OPERATIVE DIAGNOSIS:  Acute suppurative appendicitis  Procedure(s): APPENDECTOMY LAPAROSCOPIC  PROCEDURE: Laparoscopic appendectomy  SURGEON:  Sharon Mt. Dema Severin, M.D.  ANESTHESIA: General endotracheal  EBL:   10 mL  DRAINS: None  SPECIMEN:  Appendix  COUNTS:  Sponge, needle and instrument counts were reported correct x2 at conclusion of the operation  DISPOSITION:  PACU in satisfactory condition  COMPLICATIONS: None  FINDINGS: Acutely inflamed appendix which is tense. Suppurative inflammation with thin/mucopulent cocoon but no evident perforation in the appendiceal wall.   DESCRIPTION:   The patient was identified & brought into the operating room. SCDs were in place and functioning. General endotracheal anesthesia was administered. Preoperative antibiotics were administered. The patient was positioned supine with left arm tucked. Hair on the abdomen was then clipped by the OR team. A foley catheter was inserted under sterile conditions. The abdomen was prepped and draped in the standard sterile fashion. A surgical timeout confirmed our plan.  A small incision was made in the infraumbilical fold. The subcutaneous tissue was dissected and the umbilical stalk identified. The stalk was grasped with a Kocher and retracted outwardly. The infraumbilical fascia was exposed and incised. Peritoneal entry was carefully made bluntly. A 0 Vicryl purse-string suture was placed and then the [facility] port was introduced into the abdomen.  CO2 insufflation commenced to 37mHg. The laparoscope was inserted and confirmed no evidence of trocar site complications. The patient was then positioned in Trendelenburg. Two additional ports were placed - one in left lower quadrant and another in the suprapubic midline taking care to stay well above the bladder - 3 fingerbreadths above the pubic symphysis. The bed was then  slightly tilted to place the left side down.  The terminal ileum was identified and moved out of the pelvis. The appendix was identified and attachments to the appendix to the surrounding tissues were freed without difficulty - this extended down the right pelvis but was able to be brought out without difficulty. There is thin [place] mucopurulent fluid around the appendix but no frank wall disruption.  The appendix was elevated.  The base of the appendix was circumferentially dissected taking care to preserve the cecum free of injury. The base was noted to be viable and healthy appearing. The terminal ileum, cecum and ascending colon also appeared normal. The base of the appendix was then stapled with a blue load, taking a small healthy cuff of viable cecum, taking care to stay clear of the ileocecal valve. The mesoappendix was then ligated by "hugging" the appendix using the harmonic scalpel. The mesoappendix was inspected and noted to be hemostatic. The appendix was placed in an EndoBag.  The right lower quadrant was conservatively irrigated. A small oozing vessel present at the staple line controlled with two 5 mm clips. Hemostasis was noted to be achieved - taking time to inspect the ligated mesoappendix, colon mesentery, and retroperitoneum. Staple line was noted to be intact on the cecum with no bleeding. There was no perforation or injury. The right lower quadrant appeared clean and as such, no drain was placed.  The left lower quadrant and suprapubic ports were removed under direct visualization. The EndoBag was then removed through the umbilical port site and passed off as specimen. The CO2 was exhausted from the abdomen. The umbilical fascia was then closed by closing the 0 Vicryl suture. The fascia was palpated and noted to be completely closed. The skin of all port sites was  then approximated using 4-0 Monocryl suture. The incisions were covered with Dermabond.  She was then awakened from general  anesthesia, extubated, and transferred to a stretcher for transport to recover in satisfactory condition.

## 2020-11-02 NOTE — ED Notes (Signed)
Patient transported to CT 

## 2020-11-02 NOTE — Patient Instructions (Signed)
Your urinalysis dipstick is negative for white blood cells, positive for blood and ketones.   Urine pregnancy test is negative.   Go to the [facility] Emergency Department to be evaluated for acute appendicitis.

## 2020-11-02 NOTE — ED Provider Notes (Signed)
Emergency Medicine Provider Triage Evaluation Note  Misty Wright , a 30 y.o. female  was evaluated in triage.  Pt complains of RLQ abdominal pain.  Her pain started on two days ago.  Her pain was around her abdomen in general and she felt it settle into RLQ.  She is nauseated, no vomiting.   She appears uncomfortable.   Review of Systems  Positive: Abdominal pain, nausea with out vomiting.  Negative: fevers  Physical Exam  BP 126/68 (BP Location: Right Arm)   Pulse 95   Temp 100.3 F (37.9 C)   Resp 19   SpO2 100%  Gen:   Awake, appears in pain Resp:  Normal effort  MSK:   Moves extremities without difficulty  Other:  ABD TTP.   Medical Decision Making  Medically screening exam initiated at 6:34 PM.  Appropriate orders placed.  Misty Wright was informed that the remainder of the evaluation will be completed by another provider, this initial triage assessment does not replace that evaluation, and the importance of remaining in the ED until their evaluation is complete.  Angiocath insertion Performed by: Wyn Quaker  Consent: Verbal consent obtained. Risks and benefits: risks, benefits and alternatives were discussed Time out: Immediately prior to procedure a "time out" was called to verify the correct patient, procedure, equipment, support staff and site/side marked as required.  Preparation: Patient was prepped and draped in the usual sterile fashion.  Vein Location: Left AC  Not Ultrasound Guided  Gauge: 20  Normal blood return and flush without difficulty or pain Patient tolerance: Patient tolerated the procedure well with no immediate complications.  Meds ordered.  Ct scan ordered.  Concern for appendicitis.   Note: Portions of this report may have been transcribed using voice recognition software. Every effort was made to ensure accuracy; however, inadvertent computerized transcription errors may be present      Ollen Gross 11/02/20 Patricia Pesa, MD 11/02/20 [phone]

## 2020-11-02 NOTE — ED Triage Notes (Signed)
Pt here from PCP office for further eval of abd pain, rule out appendicitis. UA negative for WBC, positive for blood & ketones. Upreg negative RLQ pain, states steady ache, intermittent spikes of pain

## 2020-11-02 NOTE — Progress Notes (Signed)
   Subjective:    Patient ID: Misty Wright, female    DOB: [birth year], 30 y.o.   MRN: [id number]  HPI Chief Complaint  Patient presents with   other    Abdominal pain started three days ago lower abdomen around belly button area pain is constant but does get worse from time to time.    Complains of a 2 day history of abdominal pain that was initially peri-umbilical and now shifting to her RLQ. She also reports nausea. Pain is worse with sitting, standing and movement. Reports the drive here was miserable due to pain when going over bumps. Pain is relieved when lying down.  Looser stools than usual.   Denies fever, chills, dizziness, chest pain, palpitations, vomiting or urinary symptoms.   States she took Ibuprofen this morning and mild relief. Has not taken anything since.   LMP: first one last week since being pregnant and breast feeding.   Reviewed allergies, medications, past medical, surgical, family, and social history.   Review of Systems Pertinent positives and negatives in the history of present illness.     Objective:   Physical Exam Constitutional:      General: She is in acute distress.     Appearance: She is not toxic-appearing.  Eyes:     Conjunctiva/sclera: Conjunctivae normal.  Cardiovascular:     Rate and Rhythm: Normal rate and regular rhythm.     Pulses: Normal pulses.     Heart sounds: Normal heart sounds.  Pulmonary:     Effort: Pulmonary effort is normal.     Breath sounds: Normal breath sounds.  Abdominal:     General: Bowel sounds are decreased.     Palpations: Abdomen is soft.     Tenderness: There is abdominal tenderness in the right lower quadrant and periumbilical area. There is guarding and rebound. There is no right CVA tenderness or left CVA tenderness. Positive signs include McBurney's sign and psoas sign. Negative signs include Murphy's sign.  Musculoskeletal:     Cervical back: Normal range of motion and neck supple.  Skin:     General: Skin is warm and dry.     Capillary Refill: Capillary refill takes less than 2 seconds.     Coloration: Skin is not pale.  Neurological:     Mental Status: She is alert and oriented to person, place, and time.     Cranial Nerves: Cranial nerves are intact.     Sensory: Sensation is intact.     Motor: Motor function is intact.  Psychiatric:        Attention and Perception: Attention normal.        Mood and Affect: Mood and affect normal.        Speech: Speech normal.        Behavior: Behavior normal.   BP 128/72   Pulse 91   Temp 98.6 F (37 C)   Wt 211 lb 3.2 oz (95.8 kg)   Breastfeeding Yes   BMI 32.83 kg/m       Assessment & Plan:  Right lower quadrant abdominal tenderness with rebound tenderness - Plan: POCT URINALYSIS DIP (CLINITEK), POCT urine pregnancy  Periumbilical abdominal pain  Urine pregnancy negative.  UA +blood and ketones, negative otherwise.  H& P suspicious for acute appendicitis. She feels that she can safely drive to the [facility] ED but she is in a quite a bit of pain. Currently afebrile. Recommend evaluation in ED setting.

## 2020-11-02 NOTE — Anesthesia Procedure Notes (Signed)
Procedure Name: Intubation Date/Time: 11/02/2020 10:51 PM Performed by: Clovis Cao, CRNA Pre-anesthesia Checklist: Patient identified, Emergency Drugs available, Suction available and Patient being monitored Patient Re-evaluated:Patient Re-evaluated prior to induction Oxygen Delivery Method: Circle system utilized Preoxygenation: Pre-oxygenation with 100% oxygen Induction Type: IV induction, Rapid sequence and Cricoid Pressure applied Laryngoscope Size: Miller and 2 Grade View: Grade I Tube type: Oral Tube size: 7.0 mm Number of attempts: 1 Airway Equipment and Method: Stylet Placement Confirmation: ETT inserted through vocal cords under direct vision, positive ETCO2 and breath sounds checked- equal and bilateral Tube secured with: Tape Dental Injury: Teeth and Oropharynx as per pre-operative assessment

## 2020-11-03 ENCOUNTER — Encounter (HOSPITAL_COMMUNITY): Payer: Self-pay | Admitting: Surgery

## 2020-11-03 DIAGNOSIS — Z9049 Acquired absence of other specified parts of digestive tract: Secondary | ICD-10-CM

## 2020-11-03 LAB — CBC
HCT: 39.2 % (ref 36.0–46.0)
Hemoglobin: 13.3 g/dL (ref 12.0–15.0)
MCH: 29 pg (ref 26.0–34.0)
MCHC: 33.9 g/dL (ref 30.0–36.0)
MCV: 85.6 fL (ref 80.0–100.0)
Platelets: 211 10*3/uL (ref 150–400)
RBC: 4.58 MIL/uL (ref 3.87–5.11)
RDW: 13.1 % (ref 11.5–15.5)
WBC: 14.3 10*3/uL — ABNORMAL HIGH (ref 4.0–10.5)
nRBC: 0 % (ref 0.0–0.2)

## 2020-11-03 MED ORDER — OXYCODONE HCL 5 MG/5ML PO SOLN
5.0000 mg | Freq: Once | ORAL | Status: DC | PRN
Start: 2020-11-03 — End: 2020-11-03

## 2020-11-03 MED ORDER — FENTANYL CITRATE (PF) 100 MCG/2ML IJ SOLN
INTRAMUSCULAR | Status: AC
  Filled 2020-11-03: qty 2

## 2020-11-03 MED ORDER — HEPARIN SODIUM (PORCINE) 5000 UNIT/ML IJ SOLN: 5000.0000 [IU] | Freq: Three times a day (TID) | INTRAMUSCULAR | Status: DC

## 2020-11-03 MED ORDER — SIMETHICONE 80 MG PO CHEW: 40.0000 mg | CHEWABLE_TABLET | Freq: Four times a day (QID) | ORAL | Status: DC | PRN

## 2020-11-03 MED ORDER — DIPHENHYDRAMINE HCL 50 MG/ML IJ SOLN: 12.5000 mg | Freq: Four times a day (QID) | INTRAMUSCULAR | Status: DC | PRN

## 2020-11-03 MED ORDER — ACETAMINOPHEN 500 MG PO TABS
1000.0000 mg | ORAL_TABLET | Freq: Four times a day (QID) | ORAL | Status: DC
  Administered 2020-11-03: 1000 mg via ORAL
  Filled 2020-11-03: qty 2

## 2020-11-03 MED ORDER — HYDROMORPHONE HCL 1 MG/ML IJ SOLN
0.5000 mg | INTRAMUSCULAR | Status: DC | PRN
  Administered 2020-11-03: 0.5 mg via INTRAVENOUS
  Filled 2020-11-03: qty 0.5

## 2020-11-03 MED ORDER — ONDANSETRON HCL 4 MG/2ML IJ SOLN: 4.0000 mg | Freq: Four times a day (QID) | INTRAMUSCULAR | Status: DC | PRN

## 2020-11-03 MED ORDER — HYDRALAZINE HCL 20 MG/ML IJ SOLN
10.0000 mg | INTRAMUSCULAR | Status: DC | PRN
Start: 2020-11-03 — End: 2020-11-03

## 2020-11-03 MED ORDER — TRAMADOL HCL 50 MG PO TABS: 50.0000 mg | ORAL_TABLET | Freq: Four times a day (QID) | ORAL | 0 refills | Status: DC | PRN

## 2020-11-03 MED ORDER — TRAMADOL HCL 50 MG PO TABS: 50.0000 mg | ORAL_TABLET | Freq: Four times a day (QID) | ORAL | Status: DC | PRN

## 2020-11-03 MED ORDER — IBUPROFEN 200 MG PO TABS: 600.0000 mg | ORAL_TABLET | Freq: Four times a day (QID) | ORAL | Status: DC | PRN

## 2020-11-03 MED ORDER — LACTATED RINGERS IV SOLN: INTRAVENOUS | Status: DC

## 2020-11-03 MED ORDER — DOCUSATE SODIUM 100 MG PO CAPS
200.0000 mg | ORAL_CAPSULE | Freq: Two times a day (BID) | ORAL | Status: DC
  Administered 2020-11-03: 200 mg via ORAL
  Filled 2020-11-03: qty 2

## 2020-11-03 MED ORDER — ONDANSETRON 4 MG PO TBDP: 4.0000 mg | ORAL_TABLET | Freq: Four times a day (QID) | ORAL | Status: DC | PRN

## 2020-11-03 MED ORDER — OXYCODONE HCL 5 MG PO TABS: 5.0000 mg | ORAL_TABLET | Freq: Once | ORAL | Status: DC | PRN

## 2020-11-03 MED ORDER — PROMETHAZINE HCL 25 MG/ML IJ SOLN: 6.2500 mg | INTRAMUSCULAR | Status: DC | PRN

## 2020-11-03 MED ORDER — AMOXICILLIN-POT CLAVULANATE 875-125 MG PO TABS: 1.0000 | ORAL_TABLET | Freq: Two times a day (BID) | ORAL | 0 refills | Status: DC

## 2020-11-03 MED ORDER — DIPHENHYDRAMINE HCL 12.5 MG/5ML PO ELIX: 12.5000 mg | ORAL_SOLUTION | Freq: Four times a day (QID) | ORAL | Status: DC | PRN

## 2020-11-03 MED ORDER — FENTANYL CITRATE (PF) 100 MCG/2ML IJ SOLN
25.0000 ug | INTRAMUSCULAR | Status: DC | PRN
  Administered 2020-11-03 (×2): 50 ug via INTRAVENOUS

## 2020-11-03 MED ORDER — AMOXICILLIN-POT CLAVULANATE 875-125 MG PO TABS
1.0000 | ORAL_TABLET | Freq: Two times a day (BID) | ORAL | Status: DC
  Administered 2020-11-03: 1 via ORAL
  Filled 2020-11-03: qty 1

## 2020-11-03 NOTE — Progress Notes (Signed)
Pacu RN Report to floor given  Gave report to [employer]. [phone]. Discussed surgery, meds given in OR and Pacu, VS, IV fluids given, EBL, urine output, pain and other pertinent information. Also discussed if pt had any family or friends here or belongings with them. Pt is breastfeeding. She has a small hand pump from the ED, has used it and dumped already. DTV [id number]. Husband is aware she is staying overnight.   Pt exits my care.

## 2020-11-03 NOTE — Anesthesia Postprocedure Evaluation (Signed)
Anesthesia Post Note  Patient: Misty Wright  Procedure(s) Performed: APPENDECTOMY LAPAROSCOPIC     Patient location during evaluation: PACU Anesthesia Type: General Level of consciousness: awake and alert Pain management: pain level controlled Vital Signs Assessment: post-procedure vital signs reviewed and stable Respiratory status: spontaneous breathing, nonlabored ventilation and respiratory function stable Cardiovascular status: stable and blood pressure returned to baseline Anesthetic complications: no   No notable events documented.  Last Vitals:  Vitals:   11/03/20 0045 11/03/20 0050  BP:  116/61  Pulse: 94 88  Resp: 20 16  Temp:  36.7 C  SpO2: 98% 96%    Last Pain:  Vitals:   11/03/20 0050  TempSrc: Temporal  PainSc: [place]

## 2020-11-03 NOTE — Discharge Summary (Signed)
Physician Discharge Summary  Patient ID: Misty Wright MRN: [id number] DOB/AGE: 11-18-1990 30 y.o.  Admit date: 11/02/2020 Discharge date: 11/03/2020  Admission Diagnoses: acute appendicitis  Discharge Diagnoses:  Active Problems:   S/P laparoscopic appendectomy   Discharged Condition: good  Hospital Course: PT admitted post op.  Please see op note for full details.  Pt with no pain issues. Tol PO well Was ambulating well on her own. Deemed stable for DC and DC'd home  Consults: None  Significant Diagnostic Studies: none  Treatments: surgery: as above  Discharge Exam: Blood pressure (!) 109/59, pulse 61, temperature 97.6 F (36.4 C), temperature source Oral, resp. rate 17, height '5\' 7"'$  (1.702 m), weight 96.7 kg, SpO2 98 %, currently breastfeeding. General appearance: alert and cooperative GI: soft, non-tender; bowel sounds normal; no masses,  no organomegaly and inc c/d/i  Disposition: Discharge disposition: 01-Home or Self Care       Discharge Instructions     Diet - low sodium heart healthy   Complete by: As directed    Increase activity slowly   Complete by: As directed       Allergies as of 11/03/2020       Reactions   Mold Extract [trichophyton] Other (See Comments)   Caused sinus infection.        Medication List     TAKE these medications    amoxicillin-clavulanate 875-125 MG tablet Commonly known as: AUGMENTIN Take 1 tablet by mouth every 12 (twelve) hours.   prenatal multivitamin Tabs tablet Take 1 tablet by mouth daily at 12 noon.   traMADol 50 MG tablet Commonly known as: ULTRAM Take 1 tablet (50 mg total) by mouth every 6 (six) hours as needed for moderate pain.        Follow-up [facility], [place]. Schedule an appointment as soon as possible for a visit in 2 week(s).   Specialty: General Surgery Why: Post op visit Contact information: [address] [place]  [place] [phone]                Signed: Ralene Ok 11/03/2020, 8:43 AM

## 2020-11-03 NOTE — Progress Notes (Signed)
Avs given and explained to patient.

## 2020-11-03 NOTE — Transfer of Care (Signed)
Immediate Anesthesia Transfer of Care Note  Patient: Misty Wright  Procedure(s) Performed: APPENDECTOMY LAPAROSCOPIC  Patient Location: PACU  Anesthesia Type:General  Level of Consciousness: drowsy  Airway & Oxygen Therapy: Patient Spontanous Breathing  Post-op Assessment: Report given to RN and Post -op Vital signs reviewed and stable  Post vital signs: Reviewed and stable  Last Vitals:  Vitals Value Taken Time  BP 122/59 11/03/20 0002  Temp    Pulse 98 11/03/20 0004  Resp 26 11/03/20 0004  SpO2 100 % 11/03/20 0004  Vitals shown include unvalidated device data.  Last Pain:  Vitals:   11/02/20 1916  PainSc: 5          Complications: No notable events documented.

## 2020-11-03 NOTE — Plan of Care (Signed)

## 2020-11-05 ENCOUNTER — Encounter (HOSPITAL_COMMUNITY): Payer: Self-pay | Admitting: Surgery

## 2020-11-06 LAB — SURGICAL PATHOLOGY

## 2021-05-15 ENCOUNTER — Encounter: Payer: Managed Care, Other (non HMO) | Admitting: Family Medicine

## 2021-05-23 ENCOUNTER — Ambulatory Visit (INDEPENDENT_AMBULATORY_CARE_PROVIDER_SITE_OTHER): Payer: Managed Care, Other (non HMO) | Admitting: Nurse Practitioner

## 2021-05-23 ENCOUNTER — Encounter: Payer: Self-pay | Admitting: Nurse Practitioner

## 2021-05-23 VITALS — BP 114/80 | HR 86 | Temp 97.0°F | Ht 66.0 in | Wt 215.4 lb

## 2021-05-23 DIAGNOSIS — R7989 Other specified abnormal findings of blood chemistry: Secondary | ICD-10-CM | POA: Diagnosis not present

## 2021-05-23 DIAGNOSIS — Z800 Family history of malignant neoplasm of digestive organs: Secondary | ICD-10-CM | POA: Insufficient documentation

## 2021-05-23 DIAGNOSIS — F53 Postpartum depression: Secondary | ICD-10-CM | POA: Diagnosis not present

## 2021-05-23 DIAGNOSIS — Z8659 Personal history of other mental and behavioral disorders: Secondary | ICD-10-CM | POA: Insufficient documentation

## 2021-05-23 DIAGNOSIS — Z0000 Encounter for general adult medical examination without abnormal findings: Secondary | ICD-10-CM | POA: Diagnosis not present

## 2021-05-23 LAB — CBC WITH DIFFERENTIAL/PLATELET
Basophils Absolute: 0 10*3/uL (ref 0.0–0.1)
Basophils Relative: 0.5 % (ref 0.0–3.0)
Eosinophils Absolute: 0.2 10*3/uL (ref 0.0–0.7)
Eosinophils Relative: 2 % (ref 0.0–5.0)
HCT: 40.9 % (ref 36.0–46.0)
Hemoglobin: 13.8 g/dL (ref 12.0–15.0)
Lymphocytes Relative: 19.8 % (ref 12.0–46.0)
Lymphs Abs: 1.7 10*3/uL (ref 0.7–4.0)
MCHC: 33.8 g/dL (ref 30.0–36.0)
MCV: 84.7 fl (ref 78.0–100.0)
Monocytes Absolute: 0.7 10*3/uL (ref 0.1–1.0)
Monocytes Relative: 7.5 % (ref 3.0–12.0)
Neutro Abs: 6.1 10*3/uL (ref 1.4–7.7)
Neutrophils Relative %: 70.2 % (ref 43.0–77.0)
Platelets: 270 10*3/uL (ref 150.0–400.0)
RBC: 4.82 Mil/uL (ref 3.87–5.11)
RDW: 13.7 % (ref 11.5–15.5)
WBC: 8.7 10*3/uL (ref 4.0–10.5)

## 2021-05-23 LAB — COMPREHENSIVE METABOLIC PANEL
ALT: 52 U/L — ABNORMAL HIGH (ref 0–35)
AST: 41 U/L — ABNORMAL HIGH (ref 0–37)
Albumin: 4.6 g/dL (ref 3.5–5.2)
Alkaline Phosphatase: 93 U/L (ref 39–117)
BUN: 9 mg/dL (ref 6–23)
CO2: 30 mEq/L (ref 19–32)
Calcium: 9.5 mg/dL (ref 8.4–10.5)
Chloride: 102 mEq/L (ref 96–112)
Creatinine, Ser: 0.77 mg/dL (ref 0.40–1.20)
GFR: 102.01 mL/min (ref >60.00)
Glucose, Bld: 85 mg/dL (ref 70–99)
Potassium: 4.4 mEq/L (ref 3.5–5.1)
Sodium: 139 mEq/L (ref 135–145)
Total Bilirubin: 0.3 mg/dL (ref 0.2–1.2)
Total Protein: 7.4 g/dL (ref 6.0–8.3)

## 2021-05-23 LAB — TSH: TSH: 1.34 u[IU]/mL (ref 0.35–5.50)

## 2021-05-23 NOTE — Patient Instructions (Signed)
Thank you for choosing [facility] ? ?Go to lab for blood draw. ? ?Let me know if you have additional information about Grandmother's age of colon cancer diagnosis. ? ?Preventive Care 20-33 Years Old, Female ?Preventive care refers to lifestyle choices and visits with your health care provider that can promote health and wellness. Preventive care visits are also called wellness exams. ?What can I expect for my preventive care visit? ?Counseling ?During your preventive care visit, your health care provider may ask about your: ?Medical history, including: ?Past medical problems. ?Family medical history. ?Pregnancy history. ?Current health, including: ?Menstrual cycle. ?Method of birth control. ?Emotional well-being. ?Home life and relationship well-being. ?Sexual activity and sexual health. ?Lifestyle, including: ?Alcohol, nicotine or tobacco, and drug use. ?Access to firearms. ?Diet, exercise, and sleep habits. ?Work and work Statistician. ?Sunscreen use. ?Safety issues such as seatbelt and bike helmet use. ?Physical exam ?Your health care provider may check your: ?Height and weight. These may be used to calculate your BMI (body mass index). BMI is a measurement that tells if you are at a healthy weight. ?Waist circumference. This measures the distance around your waistline. This measurement also tells if you are at a healthy weight and may help predict your risk of certain diseases, such as type 2 diabetes and high blood pressure. ?Heart rate and blood pressure. ?Body temperature. ?Skin for abnormal spots. ?What immunizations do I need? ?Vaccines are usually given at various ages, according to a schedule. Your health care provider will recommend vaccines for you based on your age, medical history, and lifestyle or other factors, such as travel or where you work. ?What tests do I need? ?Screening ?Your health care provider may recommend screening tests for certain conditions. This may include: ?Pelvic exam  and Pap test. ?Lipid and cholesterol levels. ?Diabetes screening. This is done by checking your blood sugar (glucose) after you have not eaten for a while (fasting). ?Hepatitis B test. ?Hepatitis C test. ?HIV (human immunodeficiency virus) test. ?STI (sexually transmitted infection) testing, if you are at risk. ?BRCA-related cancer screening. This may be done if you have a family history of breast, ovarian, tubal, or peritoneal cancers. ?Talk with your health care provider about your test results, treatment options, and if necessary, the need for more tests. ?Follow these instructions at home: ?Eating and drinking ? ?Eat a healthy diet that includes fresh fruits and vegetables, whole grains, lean protein, and low-fat dairy products. ?Take vitamin and mineral supplements as recommended by your health care provider. ?Do not drink alcohol if: ?Your health care provider tells you not to drink. ?You are pregnant, may be pregnant, or are planning to become pregnant. ?If you drink alcohol: ?Limit how much you have to 0-1 drink a day. ?Know how much alcohol is in your drink. In the [place] one drink equals one 12 oz bottle of beer (355 mL), one 5 oz glass of wine (148 mL), or one 1? oz glass of hard liquor (44 mL). ?Lifestyle ?Brush your teeth every morning and night with fluoride toothpaste. Floss one time each day. ?Exercise for at least 30 minutes 5 or more days each week. ?Do not use any products that contain nicotine or tobacco. These products include cigarettes, chewing tobacco, and vaping devices, such as e-cigarettes. If you need help quitting, ask your health care provider. ?Do not use drugs. ?If you are sexually active, practice safe sex. Use a condom or other form of protection to prevent STIs. ?If you do not wish to become pregnant,  use a form of birth control. If you plan to become pregnant, see your health care provider for a prepregnancy visit. ?Find healthy ways to manage stress, such as: ?Meditation, yoga, or  listening to music. ?Journaling. ?Talking to a trusted person. ?Spending time with friends and family. ?Minimize exposure to UV radiation to reduce your risk of skin cancer. ?Safety ?Always wear your seat belt while driving or riding in a vehicle. ?Do not drive: ?If you have been drinking alcohol. Do not ride with someone who has been drinking. ?If you have been using any mind-altering substances or drugs. ?While texting. ?When you are tired or distracted. ?Wear a helmet and other protective equipment during sports activities. ?If you have firearms in your house, make sure you follow all gun safety procedures. ?Seek help if you have been physically or sexually abused. ?What's next? ?Go to your health care provider once a year for an annual wellness visit. ?Ask your health care provider how often you should have your eyes and teeth checked. ?Stay up to date on all vaccines. ?This information is not intended to replace advice given to you by your health care provider. Make sure you discuss any questions you have with your health care provider. ?Document Revised: 08/08/2020 Document Reviewed: 08/08/2020 ?Elsevier Patient Education ? [place]. ? ?

## 2021-05-23 NOTE — Assessment & Plan Note (Signed)
Stable, denies need for medication or counseling at this time. ?Support system: husband and friends. ?

## 2021-05-23 NOTE — Progress Notes (Signed)
? ?Complete physical exam ? ?Patient: Misty Wright   DOB: 1991/01/27   31 y.o. Female  MRN: [id number] ?Visit Date: 05/23/2021 ? ?Subjective:  ?  ?Chief Complaint  ?Patient presents with  ? Annual Exam  ?  New patient/Physical-No breast or pap exam needed, pt has GYN.  ?Pt is not fasting.  ?Pt has corn on her right big toe that she would like to discuss  ? ?GYN: [facility]. ?Will schedule f/up. ? ?Misty Wright is a 31 y.o. female who presents today for a complete physical exam. She reports consuming a general diet.  none  She generally feels fairly well. She reports sleeping fairly well. She does not have additional problems to discuss today.  ?Vision:Yes ?Dental:Yes ?STD Screen:No ? ?Most recent fall risk assessment: ? ?  05/23/2021  ?  1:10 PM  ?Fall Risk   ?Falls in the past year? 0  ?Number falls in past yr: 0  ?Injury with Fall? 0  ?Risk for fall due to : No Fall Risks  ?Follow up Falls evaluation completed  ? ?Most recent depression screenings: ? ?  05/23/2021  ?  1:40 PM 05/14/2020  ?  8:36 AM  ?PHQ 2/9 Scores  ?PHQ - 2 Score 1 0  ?PHQ- 9 Score 4   ? ?Past Medical History:  ?Diagnosis Date  ? Postpartum depression   ? Pyelonephritis   ? ?Past Surgical History:  ?Procedure Laterality Date  ? arm surgery Left   ? age 53  ? LAPAROSCOPIC APPENDECTOMY N/A 11/02/2020  ? Procedure: APPENDECTOMY LAPAROSCOPIC;  Surgeon: Ileana Roup, MD;  Location: [facility];  Service: General;  Laterality: N/A;  ? WISDOM TOOTH EXTRACTION    ? ?Social History  ? ?Socioeconomic History  ? Marital status: Married  ?  Spouse name: Not on file  ? Number of children: 3  ? Years of education: Not on file  ? Highest education level: Not on file  ?Occupational History  ? Not on file  ?Tobacco Use  ? Smoking status: Former  ?  Types: Cigarettes  ?  Quit date: 12/24/2012  ?  Years since quitting: 8.4  ? Smokeless tobacco: Never  ?Vaping Use  ? Vaping Use: Never used  ?Substance and Sexual Activity  ? Alcohol use: Not Currently   ? Drug use: No  ? Sexual activity: Yes  ?  Birth control/protection: I.U.D.  ?  Comment: paraguard-inserted 05/2020  ?Other Topics Concern  ? Not on file  ?Social History Narrative  ? Not on file  ? ?Social Determinants of Health  ? ?Financial Resource Strain: Not on file  ?Food Insecurity: Not on file  ?Transportation Needs: Not on file  ?Physical Activity: Not on file  ?Stress: Not on file  ?Social Connections: Not on file  ?Intimate Partner Violence: Not on file  ? ?Family Status  ?Relation Name Status  ? Mother  Alive  ? Father  Alive  ? Brother  Alive  ? Brother  Alive  ? MGM  Deceased  ? MGF  Deceased  ? PGM  Deceased  ? PGF  Alive  ? ?Family History  ?Problem Relation Age of Onset  ? Depression Mother   ? Mental illness Mother   ? Heart defect Brother   ? Cancer Maternal Grandmother 3  ?     colon   ? Early death Maternal Grandmother   ? Alcohol abuse Maternal Grandfather   ? AAA (abdominal aortic aneurysm) Paternal Grandmother   ?  Dementia Paternal Grandmother   ? ?Allergies  ?Allergen Reactions  ? Mold Extract [Trichophyton] Other (See Comments)  ?  Caused sinus infection.  ?  ?Patient Care Team: ?Jatoria Kneeland, Charlene Brooke, NP as PCP - General (Internal Medicine) ?Ob/Gyn, [facility]  ? ?Medications: ?Outpatient Medications Prior to Visit  ?Medication Sig  ? Prenatal Vit-Fe Fumarate-FA (MULTIVITAMIN-PRENATAL) 27-0.8 MG TABS tablet Take 1 tablet by mouth daily at 12 noon.  ? [DISCONTINUED] amoxicillin-clavulanate (AUGMENTIN) 875-125 MG tablet Take 1 tablet by mouth every 12 (twelve) hours. (Patient not taking: Reported on 05/23/2021)  ? [DISCONTINUED] traMADol (ULTRAM) 50 MG tablet Take 1 tablet (50 mg total) by mouth every 6 (six) hours as needed for moderate pain. (Patient not taking: Reported on 05/23/2021)  ? ?No facility-administered medications prior to visit.  ? ?Review of Systems  ?Constitutional:  Negative for fever.  ?HENT:  Negative for congestion and sore throat.   ?Eyes:   ?     Negative for visual  changes  ?Respiratory:  Negative for cough and shortness of breath.   ?Cardiovascular:  Negative for chest pain, palpitations and leg swelling.  ?Gastrointestinal:  Negative for abdominal pain, blood in stool, constipation, diarrhea, nausea and rectal pain.  ?Genitourinary:  Negative for dysuria, frequency and urgency.  ?Musculoskeletal:  Negative for myalgias.  ?Skin:  Negative for rash.  ?Neurological:  Negative for dizziness and headaches.  ?Hematological:  Does not bruise/bleed easily.  ?Psychiatric/Behavioral:  Negative for suicidal ideas. The patient is not nervous/anxious.   ? ? ?   ?Objective:  ?Temp (!) 97 ?F (36.1 ?C) (Temporal)   Ht 5' 6"  (1.676 m)   Wt 215 lb 6.4 oz (97.7 kg)   Breastfeeding Yes   BMI 34.77 kg/m?  ?  ? ? ? ?Physical Exam ?Vitals reviewed.  ?Cardiovascular:  ?   Rate and Rhythm: Normal rate and regular rhythm.  ?   Pulses: Normal pulses.  ?   Heart sounds: Normal heart sounds.  ?Pulmonary:  ?   Effort: Pulmonary effort is normal.  ?   Breath sounds: Normal breath sounds.  ?Abdominal:  ?   General: Bowel sounds are normal.  ?   Palpations: Abdomen is soft.  ?Musculoskeletal:     ?   General: Normal range of motion.  ?   Right lower leg: No edema.  ?   Left lower leg: No edema.  ?Skin: ?   General: Skin is warm and dry.  ?Neurological:  ?   Mental Status: She is alert and oriented to person, place, and time.  ?Psychiatric:     ?   Mood and Affect: Mood normal.     ?   Behavior: Behavior normal.     ?   Thought Content: Thought content normal.  ?  ?No results found for any visits on 05/23/21. ?   ?Assessment & Plan:  ?  ?Routine Health Maintenance and Physical Exam ? ?Immunization History  ?Administered Date(s) Administered  ? DTaP 01/22/1989, 03/30/1989, 05/26/1989, 05/21/1990  ? Hepatitis A 02/05/2016  ? Hepatitis A, Ped/Adol-2 Dose 02/05/2016  ? Hepatitis B 01/22/1989, 03/30/1989, 01/07/2011  ? Hepatitis B, ped/adol 01/22/1989, 03/30/1989, 01/07/2011  ? IPV 01/22/1989, 03/30/1989,  05/21/1990  ? Influenza Split 12/07/2014, 12/11/2017  ? Influenza,inj,Quad PF,6+ Mos 12/07/2014, 12/11/2017  ? Influenza-Unspecified 01/11/2020, 12/03/2020  ? Janssen (J&J) SARS-COV-2 Vaccination 06/02/2019  ? MMR 02/23/1990, 02/05/2016  ? PFIZER(Purple Top)SARS-COV-2 Vaccination 03/28/2019, 04/24/2019  ? PPD Test 07/25/2015  ? Tdap 08/20/2011, 02/08/2020  ? ? ?Health Maintenance  ?  Topic Date Due  ? COVID-19 Vaccine (3 - Pfizer risk series) 06/30/2019  ? PAP SMEAR-Modifier  10/12/2021  ? TETANUS/TDAP  02/07/2030  ? INFLUENZA VACCINE  Completed  ? Hepatitis C Screening  Completed  ? HIV Screening  Completed  ? HPV VACCINES  Aged Out  ? ?Discussed health benefits of physical activity, and encouraged her to engage in regular exercise appropriate for her age and condition. ? ?Problem List Items Addressed This Visit   ?None ?Visit Diagnoses   ? ? Preventative health care    -  Primary  ? Relevant Orders  ? CBC with Differential/Platelet  ? Comprehensive metabolic panel  ? TSH  ? ?  ? ?Return in about 1 year (around 05/24/2022) for CPE (fasting). ? ?  ? ?Wilfred Lacy, NP ?

## 2021-05-24 ENCOUNTER — Encounter: Payer: Self-pay | Admitting: Nurse Practitioner

## 2021-05-27 NOTE — Addendum Note (Signed)
Addended by: Wilfred Lacy L on: 05/27/2021 01:24 PM ? ? Modules accepted: Orders ? ?

## 2021-07-25 ENCOUNTER — Telehealth: Payer: Self-pay | Admitting: Nurse Practitioner

## 2021-07-25 NOTE — Telephone Encounter (Signed)
Called & spoke w/ pt about her lab appt tomorrow. Explained it's for a repeat hepatic panel, voiced understanding.

## 2021-07-25 NOTE — Telephone Encounter (Signed)
Pt returned your call about scheduling labs. She wants to know what it is that she is having tested to see if her insurance covers the test. Please call her back. Her appt is tomorrow at 4

## 2021-07-26 ENCOUNTER — Other Ambulatory Visit: Payer: Managed Care, Other (non HMO)

## 2021-07-26 DIAGNOSIS — R7989 Other specified abnormal findings of blood chemistry: Secondary | ICD-10-CM

## 2021-07-26 NOTE — Addendum Note (Signed)
Addended by: Lynnea Ferrier on: 07/26/2021 04:04 PM   Modules accepted: Orders

## 2021-07-27 LAB — HEPATIC FUNCTION PANEL
AG Ratio: 1.6 (calc) (ref 1.0–2.5)
ALT: 23 U/L (ref 6–29)
AST: 18 U/L (ref 10–30)
Albumin: 4.7 g/dL (ref 3.6–5.1)
Alkaline phosphatase (APISO): 82 U/L (ref 31–125)
Bilirubin, Direct: 0.1 mg/dL (ref 0.0–0.2)
Globulin: 3 g/dL (calc) (ref 1.9–3.7)
Indirect Bilirubin: 0.5 mg/dL (calc) (ref 0.2–1.2)
Total Bilirubin: 0.6 mg/dL (ref 0.2–1.2)
Total Protein: 7.7 g/dL (ref 6.1–8.1)

## 2021-09-02 ENCOUNTER — Encounter: Payer: Self-pay | Admitting: Family Medicine

## 2021-09-02 ENCOUNTER — Ambulatory Visit: Payer: Managed Care, Other (non HMO) | Admitting: Family Medicine

## 2021-09-02 VITALS — BP 118/78 | HR 73 | Temp 97.4°F | Wt 216.8 lb

## 2021-09-02 DIAGNOSIS — R0981 Nasal congestion: Secondary | ICD-10-CM | POA: Diagnosis not present

## 2021-09-02 DIAGNOSIS — R109 Unspecified abdominal pain: Secondary | ICD-10-CM | POA: Diagnosis not present

## 2021-09-02 HISTORY — DX: Unspecified abdominal pain: R10.9

## 2021-09-02 HISTORY — DX: Nasal congestion: R09.81
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
## 2021-09-02 NOTE — Patient Instructions (Signed)
Take flonase for nasal congestion
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
## 2021-09-02 NOTE — Assessment & Plan Note (Addendum)
Associated with mild nausea, nontender abdominal exam Tolerating p.o. Possible viral gastroenteritis Recommend supportive care, bland diet Offered medication for nausea and abdominal cramping, but patient declined at this moment Follow-up as needed Return precautions discussed

## 2021-10-25 IMAGING — CT CT ABD-PELV W/ CM
2 of 4 series · 16 of 46 positions shown, 18 images · IV contrast (Omni 300)
Comparison: None.

CLINICAL DATA: Right lower quadrant abdominal pain.

EXAM:
CT ABDOMEN AND PELVIS WITH CONTRAST
TECHNIQUE: Multidetector CT imaging of the abdomen and pelvis was performed
using the standard protocol following bolus administration of
intravenous contrast.
CONTRAST:  75mL OMNIPAQUE IOHEXOL 350 MG/ML SOLN

[Series 3: a/p w/ 5mm · axial · 0.93mm/px · z∈[-694,-234]mm · 13 of 102 slices shown, 15 images]
[im 5/102  soft-tissue]
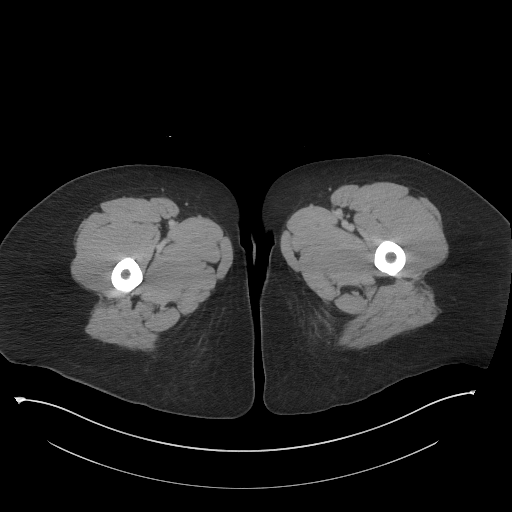
[im 5/102  bone]
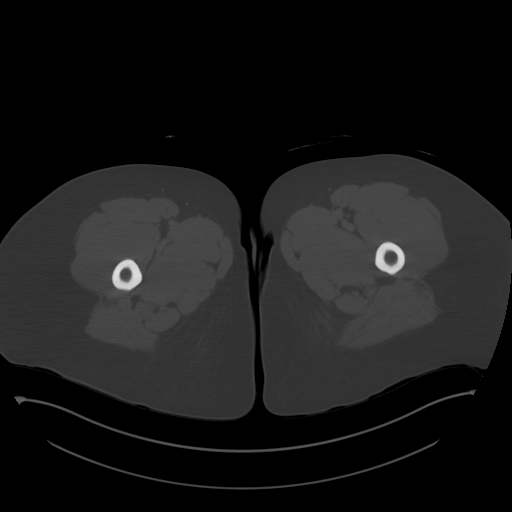
[im 13/102  soft-tissue]
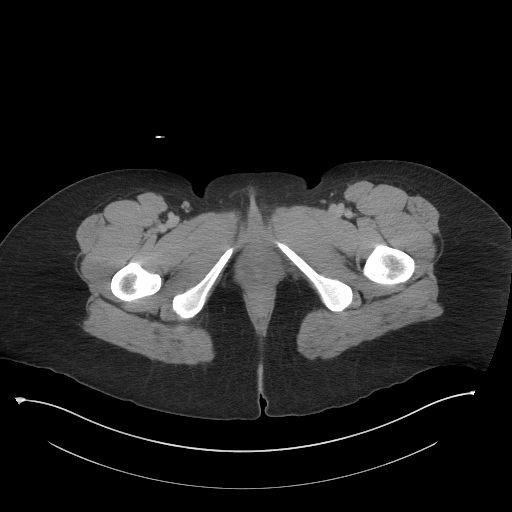
[im 22/102  soft-tissue]
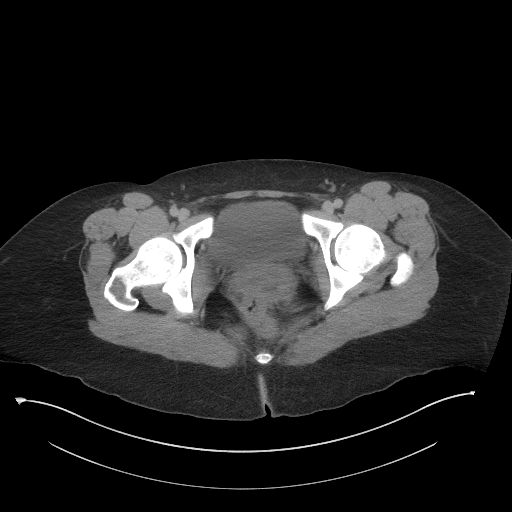
[im 30/102  soft-tissue]
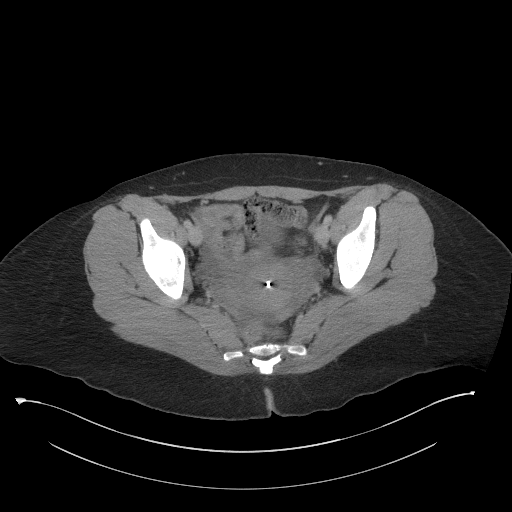
[im 34/102  soft-tissue]
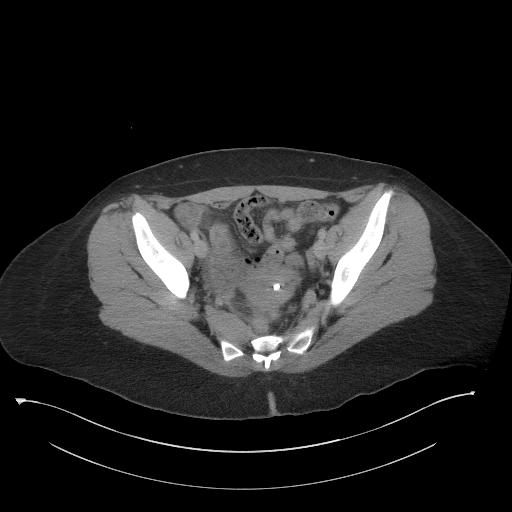
[im 43/102  soft-tissue]
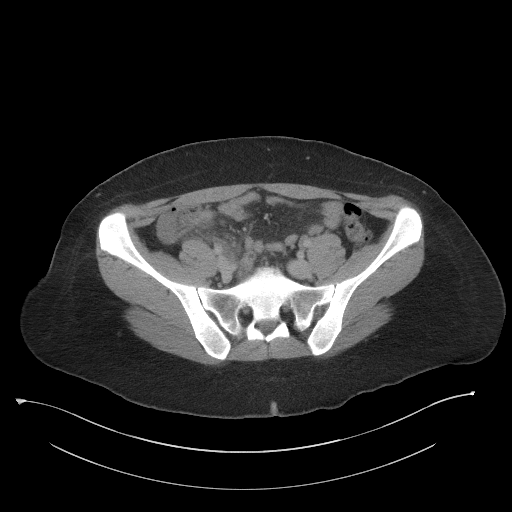
[im 51/102  soft-tissue]
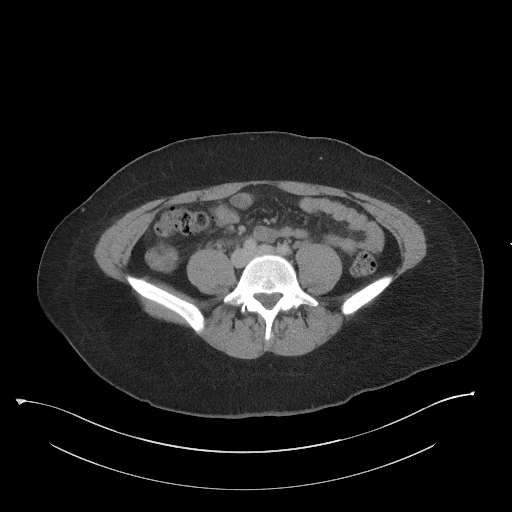
[im 59/102  soft-tissue]
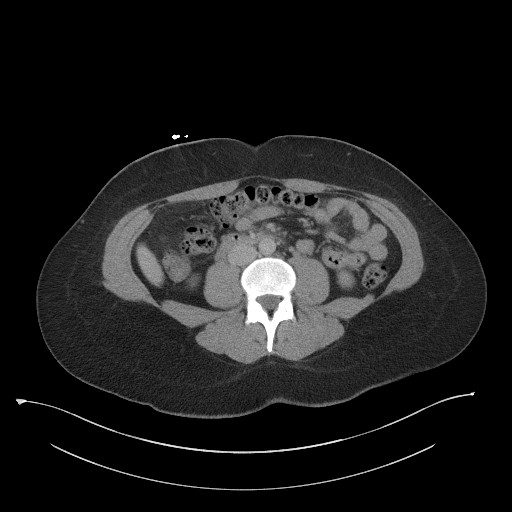
[im 68/102  soft-tissue]
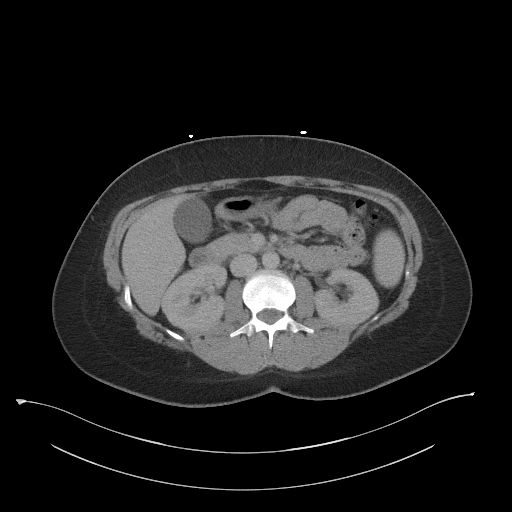
[im 68/102  bone]
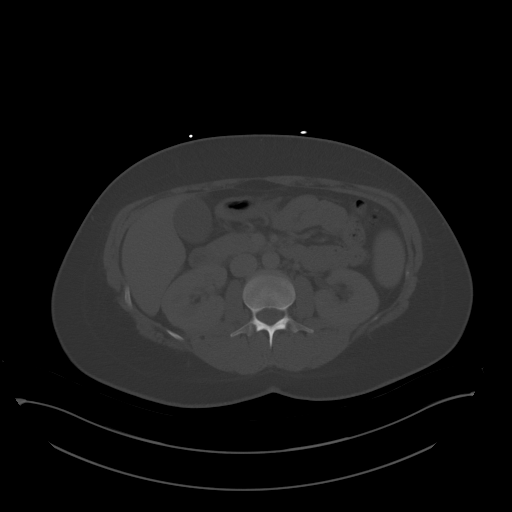
[im 72/102  soft-tissue]
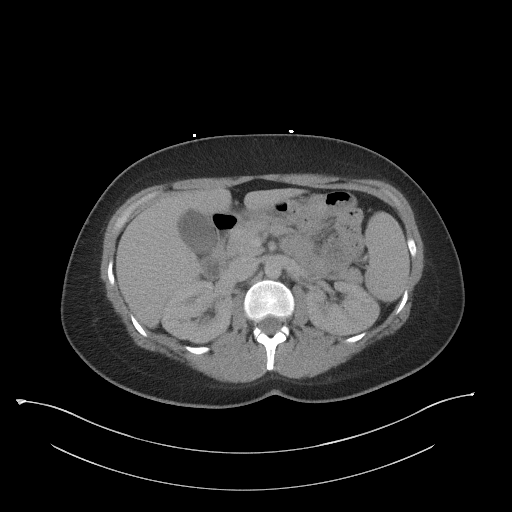
[im 80/102  soft-tissue]
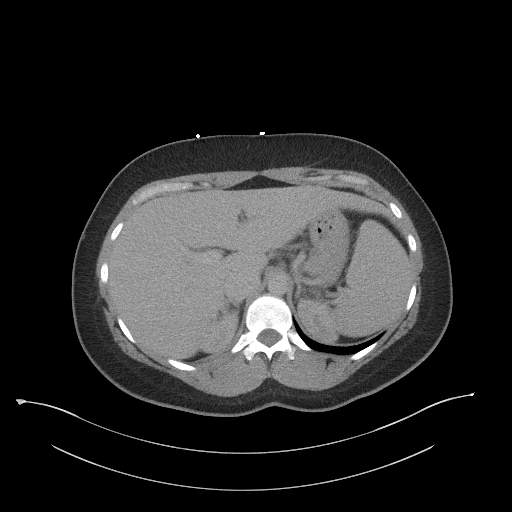
[im 89/102  soft-tissue]
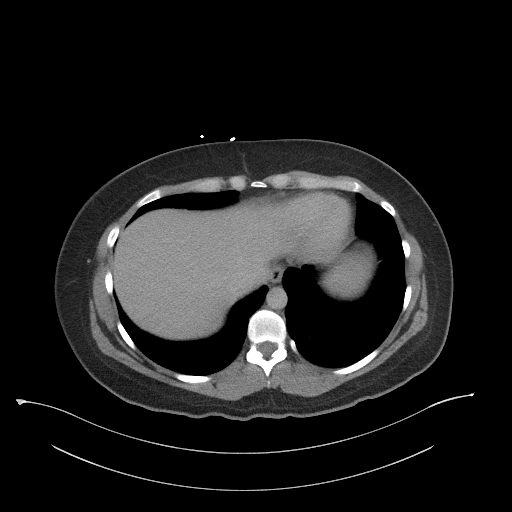
[im 97/102  soft-tissue]
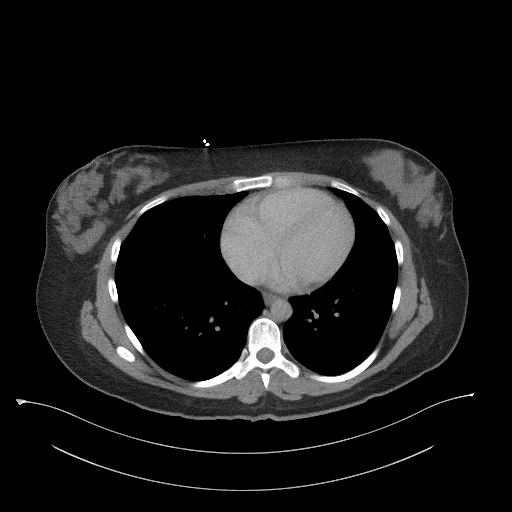

[Series 6: a/p w/ cor · coronal · 0.81mm/px · 3 of 151 slices shown]
[im 51/151  soft-tissue]
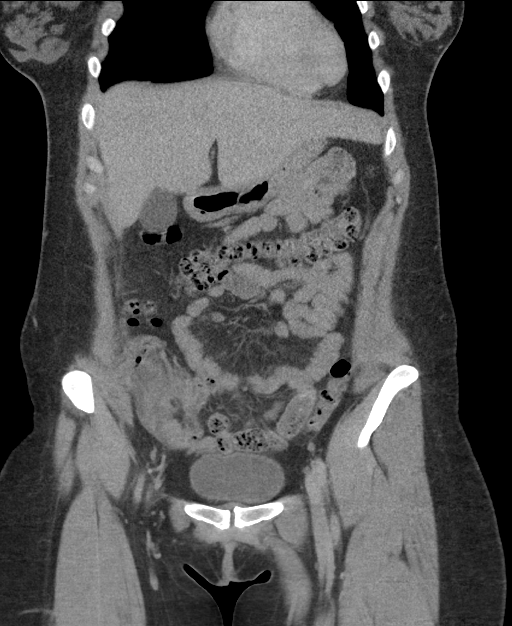
[im 67/151  soft-tissue]
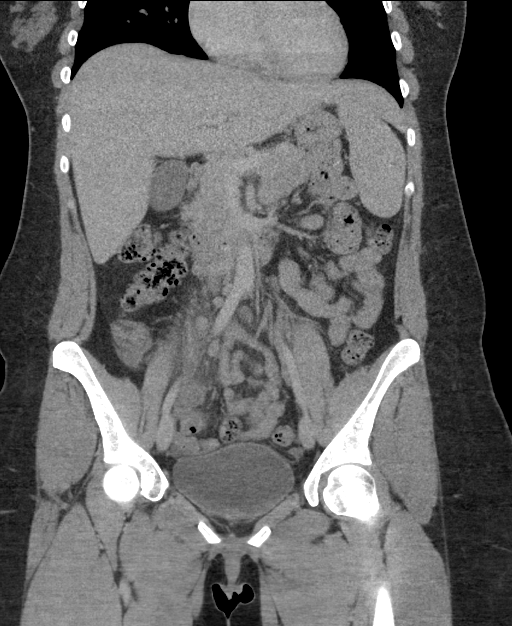
[im 84/151  soft-tissue]
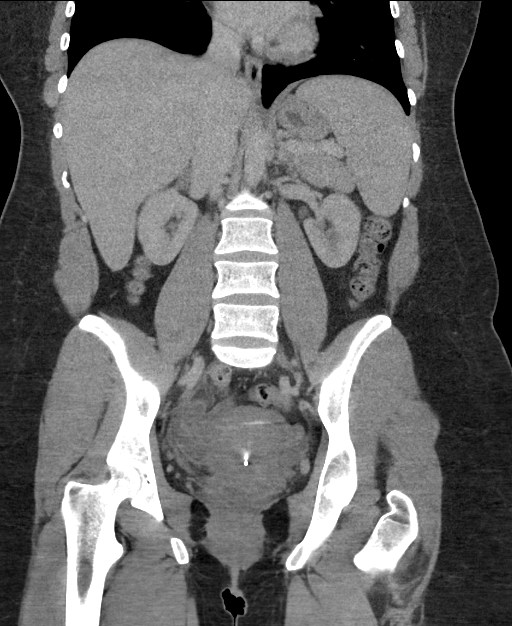

[16 of 46 positions shown; findings below may reference images not displayed]

FINDINGS: Lower chest: No acute abnormality.

Hepatobiliary: No focal liver abnormality is seen. No gallstones,
gallbladder wall thickening, or biliary dilatation.

Pancreas: Unremarkable. No pancreatic ductal dilatation or
surrounding inflammatory changes.

Spleen: Normal in size without focal abnormality.

Adrenals/Urinary Tract: Adrenal glands are unremarkable. Kidneys are
normal, without renal calculi, focal lesion, or hydronephrosis.
Bladder is unremarkable.

Stomach/Bowel: The appendix is dilated and fluid-filled measuring up
to the 12 mm in diameter. There is marked surrounding inflammatory
stranding and fluid. There is no appendicoliths, perforation or
abscess at this time. Additionally, there is wall thickening of the
cecum there is no bowel obstruction. Small bowel and stomach are
within normal limits.

Vascular/Lymphatic: No significant vascular findings are present. No
enlarged abdominal or pelvic lymph nodes.

Reproductive: An IUD is noted in the uterus. No adnexal masses are
identified.

Other: There is a small amount of free fluid in the pelvis and right
lower quadrant. There is a small fat containing umbilical hernia.

Musculoskeletal: No acute or significant osseous findings.
IMPRESSION: 1. Acute appendicitis. No evidence for perforation or abscess. Small
amount of free fluid.

## 2021-11-25 ENCOUNTER — Telehealth: Payer: Self-pay | Admitting: Nurse Practitioner

## 2021-11-25 NOTE — Telephone Encounter (Signed)
Pt has skin irritation and don't know what to do . Going on for a few months in different location

## 2021-11-25 NOTE — Telephone Encounter (Signed)
Called & spoke w/ pt, offered apt to be seen, pt says she has scheduled a dermatology appt & will be going in a few days

## 2022-05-26 ENCOUNTER — Ambulatory Visit (INDEPENDENT_AMBULATORY_CARE_PROVIDER_SITE_OTHER): Payer: BC Managed Care – PPO | Admitting: Nurse Practitioner

## 2022-05-26 ENCOUNTER — Encounter: Payer: Self-pay | Admitting: Nurse Practitioner

## 2022-05-26 VITALS — BP 120/84 | HR 58 | Temp 98.3°F | Resp 16 | Ht 66.0 in | Wt 218.4 lb

## 2022-05-26 DIAGNOSIS — N920 Excessive and frequent menstruation with regular cycle: Secondary | ICD-10-CM | POA: Diagnosis not present

## 2022-05-26 DIAGNOSIS — Z0000 Encounter for general adult medical examination without abnormal findings: Secondary | ICD-10-CM

## 2022-05-26 DIAGNOSIS — Z136 Encounter for screening for cardiovascular disorders: Secondary | ICD-10-CM | POA: Diagnosis not present

## 2022-05-26 DIAGNOSIS — Z13220 Encounter for screening for lipoid disorders: Secondary | ICD-10-CM

## 2022-05-26 LAB — COMPREHENSIVE METABOLIC PANEL
ALT: 18 U/L (ref 0–35)
AST: 18 U/L (ref 0–37)
Albumin: 4.6 g/dL (ref 3.5–5.2)
Alkaline Phosphatase: 51 U/L (ref 39–117)
BUN: 11 mg/dL (ref 6–23)
CO2: 27 mEq/L (ref 19–32)
Calcium: 9.4 mg/dL (ref 8.4–10.5)
Chloride: 104 mEq/L (ref 96–112)
Creatinine, Ser: 0.7 mg/dL (ref 0.40–1.20)
GFR: 113.56 mL/min (ref >60.00)
Glucose, Bld: 96 mg/dL (ref 70–99)
Potassium: 4.2 mEq/L (ref 3.5–5.1)
Sodium: 138 mEq/L (ref 135–145)
Total Bilirubin: 0.3 mg/dL (ref 0.2–1.2)
Total Protein: 7.1 g/dL (ref 6.0–8.3)

## 2022-05-26 LAB — IBC + FERRITIN
Ferritin: 11.9 ng/mL (ref 10.0–291.0)
Iron: 76 ug/dL (ref 42–145)
Saturation Ratios: 22.3 % (ref 20.0–50.0)
TIBC: 340.2 ug/dL (ref 250.0–450.0)
Transferrin: 243 mg/dL (ref 212.0–360.0)

## 2022-05-26 LAB — LIPID PANEL
Cholesterol: 150 mg/dL (ref 0–200)
HDL: 50.9 mg/dL (ref >39.00)
LDL Cholesterol: 80 mg/dL (ref 0–99)
NonHDL: 98.64
Total CHOL/HDL Ratio: 3
Triglycerides: 91 mg/dL (ref 0.0–149.0)
VLDL: 18.2 mg/dL (ref 0.0–40.0)

## 2022-05-26 LAB — CBC
HCT: 41.7 % (ref 36.0–46.0)
Hemoglobin: 14.1 g/dL (ref 12.0–15.0)
MCHC: 33.9 g/dL (ref 30.0–36.0)
MCV: 84.9 fl (ref 78.0–100.0)
Platelets: 263 10*3/uL (ref 150.0–400.0)
RBC: 4.91 Mil/uL (ref 3.87–5.11)
RDW: 13.6 % (ref 11.5–15.5)
WBC: 9.2 10*3/uL (ref 4.0–10.5)

## 2022-05-26 NOTE — Progress Notes (Signed)
Complete physical exam  Patient: Misty Wright   DOB: 12-02-1990   32 y.o. Female  MRN: [id number] Visit Date: 05/26/2022  Subjective:    Chief Complaint  Patient presents with   Annual Exam    Fasting no    Misty Wright is a 32 y.o. female who presents today for a complete physical exam. She reports consuming a general diet.  4x/week: video exercises-cardio  She generally feels well. She reports sleeping well. She does not have additional problems to discuss today.  Vision:No Dental:Yes STD Screen:No  BP Readings from Last 3 Encounters:  05/26/22 120/84  09/02/21 118/78  05/23/21 114/80   Wt Readings from Last 3 Encounters:  05/26/22 218 lb 6.4 oz (99.1 kg)  09/02/21 216 lb 12.8 oz (98.3 kg)  05/23/21 215 lb 6.4 oz (97.7 kg)   Most recent fall risk assessment:    05/26/2022    1:13 PM  Fall Risk   Falls in the past year? 0  Number falls in past yr: 0  Injury with Fall? 0  Risk for fall due to : No Fall Risks  Follow up Follow up appointment   Depression screen:Yes - No Depression  Most recent depression screenings:    05/26/2022    1:36 PM 05/23/2021    1:40 PM  PHQ 2/9 Scores  PHQ - 2 Score 1 1  PHQ- 9 Score 1 4   HPI  No problem-specific Assessment & Plan notes found for this encounter.  Past Medical History:  Diagnosis Date   Abdominal cramping 09/02/2021   Allergy 2016   Mold or dust   Nasal congestion 09/02/2021   Postpartum depression    Pyelonephritis    Past Surgical History:  Procedure Laterality Date   APPENDECTOMY  2022   arm surgery Left    age 12   [place] N/A 11/02/2020   Procedure: APPENDECTOMY LAPAROSCOPIC;  Surgeon: Ileana Roup, MD;  Location: [facility] OR;  Service: General;  Laterality: N/A;   WISDOM TOOTH EXTRACTION     Social History   Socioeconomic History   Marital status: Married    Spouse name: Not on file   Number of children: 3   Years of education: Not on file    Highest education level: Not on file  Occupational History   Not on file  Tobacco Use   Smoking status: Former    Types: Cigarettes    Quit date: 12/24/2012    Years since quitting: 9.4   Smokeless tobacco: Never  Vaping Use   Vaping Use: Never used  Substance and Sexual Activity   Alcohol use: Yes    Alcohol/week: 1.0 standard drink of alcohol    Types: 1 Glasses of wine per week    Comment: 1glass of wine monthly   Drug use: No   Sexual activity: Yes    Birth control/protection: I.U.D.    Comment: paraguard-inserted 05/2020  Other Topics Concern   Not on file  Social History Narrative   Not on file   Social Determinants of Health   Financial Resource Strain: Not on file  Food Insecurity: Not on file  Transportation Needs: Not on file  Physical Activity: Not on file  Stress: Not on file  Social Connections: Not on file  Intimate Partner Violence: Not on file   Family Status  Relation Name Status   Mother  Alive   Father  Alive   Brother  Alive  Brother  Alive   Duncan Deceased   MGF Lolita Rieger Deceased   PGM  Deceased   PGF  Alive   Family History  Problem Relation Age of Onset   Depression Mother    Mental illness Mother    Heart defect Brother    Cancer Maternal Grandmother 24       colon   Early death Maternal Grandmother    Alcohol abuse Maternal Grandfather    AAA (abdominal aortic aneurysm) Paternal Grandmother    Dementia Paternal Grandmother    No Active Allergies  Patient Care Team: Lehua Flores, Charlene Brooke, NP as PCP - General (Internal Medicine) Ob/Gyn, [facility]   Medications: Outpatient Medications Prior to Visit  Medication Sig   Prenatal Vit-Fe Fumarate-FA (MULTIVITAMIN-PRENATAL) 27-0.8 MG TABS tablet Take 1 tablet by mouth daily at 12 noon.   No facility-administered medications prior to visit.    Review of Systems  Constitutional:  Negative for activity change, appetite change and unexpected weight change.   Respiratory: Negative.    Cardiovascular: Negative.   Gastrointestinal: Negative.   Endocrine: Negative for cold intolerance and heat intolerance.  Genitourinary: Negative.   Musculoskeletal: Negative.   Skin: Negative.   Neurological: Negative.   Hematological: Negative.   Psychiatric/Behavioral:  Negative for behavioral problems, decreased concentration, dysphoric mood, hallucinations, self-injury, sleep disturbance and suicidal ideas. The patient is not nervous/anxious.        Objective:  BP 120/84 (BP Location: Left Arm, Patient Position: Sitting, Cuff Size: Large)   Pulse (!) 58   Temp 98.3 F (36.8 C) (Temporal)   Resp 16   Ht 5\' 6"  (1.676 m)   Wt 218 lb 6.4 oz (99.1 kg)   LMP 05/08/2022 (Exact Date)   SpO2 99%   Breastfeeding Yes   BMI 35.25 kg/m     Physical Exam Vitals and nursing note reviewed.  Constitutional:      General: She is not in acute distress. HENT:     Right Ear: Tympanic membrane, ear canal and external ear normal.     Left Ear: Tympanic membrane, ear canal and external ear normal.     Nose: Nose normal.  Eyes:     Extraocular Movements: Extraocular movements intact.     Conjunctiva/sclera: Conjunctivae normal.     Pupils: Pupils are equal, round, and reactive to light.  Neck:     Thyroid: No thyroid mass, thyromegaly or thyroid tenderness.  Cardiovascular:     Rate and Rhythm: Normal rate and regular rhythm.     Pulses: Normal pulses.     Heart sounds: Normal heart sounds.  Pulmonary:     Effort: Pulmonary effort is normal.     Breath sounds: Normal breath sounds.  Abdominal:     General: Bowel sounds are normal.     Palpations: Abdomen is soft.  Musculoskeletal:        General: Normal range of motion.     Cervical back: Normal range of motion and neck supple.     Right lower leg: No edema.     Left lower leg: No edema.  Lymphadenopathy:     Cervical: No cervical adenopathy.  Skin:    General: Skin is warm and dry.  Neurological:      Mental Status: She is alert and oriented to person, place, and time.     Cranial Nerves: No cranial nerve deficit.  Psychiatric:        Mood and Affect: Mood normal.  Behavior: Behavior normal.        Thought Content: Thought content normal.     No results found for any visits on 05/26/22.    Assessment & Plan:    Routine Health Maintenance and Physical Exam  Immunization History  Administered Date(s) Administered   DTaP 01/22/1989, 03/30/1989, 05/26/1989, 05/21/1990   Hepatitis A 02/05/2016   Hepatitis A, Ped/Adol-2 Dose 02/05/2016   Hepatitis B 01/22/1989, 03/30/1989, 01/07/2011   Hepatitis B, PED/ADOLESCENT 01/22/1989, 03/30/1989, 01/07/2011   IPV 01/22/1989, 03/30/1989, 05/21/1990   Influenza Split 12/07/2014, 12/11/2017   Influenza,inj,Quad PF,6+ Mos 12/07/2014, 12/11/2017   Influenza-Unspecified 12/07/2014, 12/11/2017, 01/11/2020, 12/03/2020   Janssen (J&J) SARS-COV-2 Vaccination 06/02/2019   MMR 02/23/1990, 02/05/2016   PFIZER(Purple Top)SARS-COV-2 Vaccination 03/28/2019, 04/24/2019   PPD Test 07/25/2015   Tdap 08/20/2011, 02/08/2020   Health Maintenance  Topic Date Due   PAP SMEAR-Modifier  10/12/2021   COVID-19 Vaccine (3 - Pfizer risk series) 06/11/2022 (Originally 06/30/2019)   INFLUENZA VACCINE  09/25/2022   DTaP/Tdap/Td (7 - Td or Tdap) 02/07/2030   Hepatitis C Screening  Completed   HIV Screening  Completed   HPV VACCINES  Aged Out   Discussed health benefits of physical activity, and encouraged her to engage in regular exercise appropriate for her age and condition. Requested PAP results from GYN  Problem List Items Addressed This Visit   None Visit Diagnoses     Preventative health care    -  Primary   Relevant Orders   Comprehensive metabolic panel   Lipid panel   Encounter for lipid screening for cardiovascular disease       Relevant Orders   Lipid panel   Menorrhagia with regular cycle       Relevant Orders   CBC   IBC + Ferritin       Return in about 1 year (around 05/26/2023) for CPE (fasting).     Wilfred Lacy, NP

## 2022-05-26 NOTE — Patient Instructions (Addendum)
Go to lab Continue Heart healthy diet and daily exercise. Sign medical release to get records from [place] 28-34 Years Old, Female Preventive care refers to lifestyle choices and visits with your health care provider that can promote health and wellness. Preventive care visits are also called wellness exams. What can I expect for my preventive care visit? Counseling During your preventive care visit, your health care provider may ask about your: Medical history, including: Past medical problems. Family medical history. Pregnancy history. Current health, including: Menstrual cycle. Method of birth control. Emotional well-being. Home life and relationship well-being. Sexual activity and sexual health. Lifestyle, including: Alcohol, nicotine or tobacco, and drug use. Access to firearms. Diet, exercise, and sleep habits. Work and work Statistician. Sunscreen use. Safety issues such as seatbelt and bike helmet use. Physical exam Your health care provider may check your: Height and weight. These may be used to calculate your BMI (body mass index). BMI is a measurement that tells if you are at a healthy weight. Waist circumference. This measures the distance around your waistline. This measurement also tells if you are at a healthy weight and may help predict your risk of certain diseases, such as type 2 diabetes and high blood pressure. Heart rate and blood pressure. Body temperature. Skin for abnormal spots. What immunizations do I need?  Vaccines are usually given at various ages, according to a schedule. Your health care provider will recommend vaccines for you based on your age, medical history, and lifestyle or other factors, such as travel or where you work. What tests do I need? Screening Your health care provider may recommend screening tests for certain conditions. This may include: Pelvic exam and Pap test. Lipid and cholesterol levels. Diabetes  screening. This is done by checking your blood sugar (glucose) after you have not eaten for a while (fasting). Hepatitis B test. Hepatitis C test. HIV (human immunodeficiency virus) test. STI (sexually transmitted infection) testing, if you are at risk. BRCA-related cancer screening. This may be done if you have a family history of breast, ovarian, tubal, or peritoneal cancers. Talk with your health care provider about your test results, treatment options, and if necessary, the need for more tests. Follow these instructions at home: Eating and drinking  Eat a healthy diet that includes fresh fruits and vegetables, whole grains, lean protein, and low-fat dairy products. Take vitamin and mineral supplements as recommended by your health care provider. Do not drink alcohol if: Your health care provider tells you not to drink. You are pregnant, may be pregnant, or are planning to become pregnant. If you drink alcohol: Limit how much you have to 0-1 drink a day. Know how much alcohol is in your drink. In the [place] one drink equals one 12 oz bottle of beer (355 mL), one 5 oz glass of wine (148 mL), or one 1 oz glass of hard liquor (44 mL). Lifestyle Brush your teeth every morning and night with fluoride toothpaste. Floss one time each day. Exercise for at least 30 minutes 5 or more days each week. Do not use any products that contain nicotine or tobacco. These products include cigarettes, chewing tobacco, and vaping devices, such as e-cigarettes. If you need help quitting, ask your health care provider. Do not use drugs. If you are sexually active, practice safe sex. Use a condom or other form of protection to prevent STIs. If you do not wish to become pregnant, use a form of birth control. If you plan  to become pregnant, see your health care provider for a prepregnancy visit. Find healthy ways to manage stress, such as: Meditation, yoga, or listening to music. Journaling. Talking to a trusted  person. Spending time with friends and family. Minimize exposure to UV radiation to reduce your risk of skin cancer. Safety Always wear your seat belt while driving or riding in a vehicle. Do not drive: If you have been drinking alcohol. Do not ride with someone who has been drinking. If you have been using any mind-altering substances or drugs. While texting. When you are tired or distracted. Wear a helmet and other protective equipment during sports activities. If you have firearms in your house, make sure you follow all gun safety procedures. Seek help if you have been physically or sexually abused. What's next? Go to your health care provider once a year for an annual wellness visit. Ask your health care provider how often you should have your eyes and teeth checked. Stay up to date on all vaccines. This information is not intended to replace advice given to you by your health care provider. Make sure you discuss any questions you have with your health care provider. Document Revised: 08/08/2020 Document Reviewed: 08/08/2020 Elsevier Patient Education  [employer].

## 2022-05-28 NOTE — Progress Notes (Signed)
Stable Follow instructions as discussed during office visit.

## 2023-02-09 DIAGNOSIS — Z01419 Encounter for gynecological examination (general) (routine) without abnormal findings: Secondary | ICD-10-CM | POA: Diagnosis not present

## 2023-05-08 ENCOUNTER — Ambulatory Visit: Payer: Self-pay | Admitting: Nurse Practitioner

## 2023-05-08 ENCOUNTER — Encounter: Payer: Self-pay | Admitting: Family Medicine

## 2023-05-08 ENCOUNTER — Ambulatory Visit: Admitting: Family Medicine

## 2023-05-08 VITALS — BP 100/60 | HR 70 | Temp 98.2°F | Resp 18 | Ht 66.0 in | Wt 230.0 lb

## 2023-05-08 DIAGNOSIS — R198 Other specified symptoms and signs involving the digestive system and abdomen: Secondary | ICD-10-CM | POA: Diagnosis not present

## 2023-05-08 NOTE — Telephone Encounter (Signed)
  Chief Complaint: abnormal stools Symptoms: mild intermittent abdominal pain, loose stool with white "pieces or strings" Frequency: started yesterday, progressed today Pertinent Negatives: Patient denies fever, diarrhea, nausea, vomiting, anal itching, new medications. Disposition: [] ED /[] Urgent Care (no appt availability in office) / [x] Appointment(In office/virtual)/ []  [facility]/ [] Home Care/ [] Refused Recommended Disposition /[] [facility]/ []  Follow-up with PCP Additional Notes: Patient states she noticed loose stools yesterday and white pieces/small strings in her stool today. She is concerned and would like to have her stool tested. She is agreeable to acute visit, scheduled at available location [facility].  Copied from CRM [id number]. Topic: Clinical - Red Word Triage >> May 08, 2023  9:26 AM Gurney Maxin H wrote: [facility] that prompted transfer to Nurse Triage: Digestive issues and seen white pieces in stool and wants to know is she needs to get tested for worms, mild stomach ache. Reason for Disposition  [1] Abnormal color is unexplained AND [2] persists > 24 hours  Answer Assessment - Initial Assessment Questions 1. COLOR: "What color is it?" "Is that color in part or all of the stool?"     Light brown, loose stool with white small strings or little pieces.  2. ONSET: "When was the unusual color first noted?"     The white pieces she states she noticed today, and loose stools since yesterday.  3. CAUSE: "Have you eaten any food or taken any medicine of this color?" Note: See listing in Background Information section.      She states the only thing out of her normal diet was smoked salmon for sushi a few nights ago.  4. OTHER SYMPTOMS: "Do you have any other symptoms?" (e.g., abdomen pain, diarrhea, jaundice, fever).     Mild belly pain under belly button intermittent.  Protocols used: Stools - Unusual Color-A-AH

## 2023-05-08 NOTE — Progress Notes (Signed)
 Established Patient Office Visit  Subjective   Patient ID: Misty Wright, female    DOB: 1990-07-20  Age: 33 y.o. MRN: [id number]  Chief Complaint  Patient presents with   Diarrhea    X1 day, some cramping and pain with the bowel movement. No N/V. No blood or discharge in stool.     HPI Discussed the use of AI scribe software for clinical note transcription with the patient, who gave verbal consent to proceed.  History of Present Illness   Misty Wright is a 33 year old female who presents with loose stools and white pieces in her stool.  She experiences loose bowel movements with white pieces in her stool, occurring once or twice a day. The stools are loose but not diarrhea. No associated symptoms such as fever, abdominal pain, or rectal itching are present.  She recalls consuming cashews the day before noticing the white pieces in her stool, raising the possibility of undigested food particles.  She denies any recent travel but mentions her mother recently visited after a month-long stay in [place].  She is concerned about the possibility of worms but denies seeing any moving worms in her stool.      Patient Active Problem List   Diagnosis Date Noted   Family history of colon cancer 05/23/2021   History of postpartum depression 05/23/2021   S/P laparoscopic appendectomy 11/03/2020   Atypical nevi 05/13/2019   Ingrown toenail 08/14/2015   Past Medical History:  Diagnosis Date   Abdominal cramping 09/02/2021   Allergy 2016   Mold or dust   Nasal congestion 09/02/2021   Postpartum depression    Pyelonephritis    Past Surgical History:  Procedure Laterality Date   APPENDECTOMY  2022   arm surgery Left    age 68   FRACTURE SURGERY  1997   LAPAROSCOPIC APPENDECTOMY N/A 11/02/2020   Procedure: APPENDECTOMY LAPAROSCOPIC;  Surgeon: Andria Meuse, MD;  Location: [facility] OR;  Service: General;  Laterality: N/A;   WISDOM TOOTH EXTRACTION     Social  History   Tobacco Use   Smoking status: Former    Current packs/day: 0.00    Types: Cigarettes    Quit date: 12/24/2012    Years since quitting: 10.3   Smokeless tobacco: Never  Vaping Use   Vaping status: Never Used  Substance Use Topics   Alcohol use: Yes    Alcohol/week: 1.0 standard drink of alcohol    Types: 1 Glasses of wine per week    Comment: 1glass of wine monthly   Drug use: No   Social History   Socioeconomic History   Marital status: Married    Spouse name: Not on file   Number of children: 3   Years of education: Not on file   Highest education level: Not on file  Occupational History   Not on file  Tobacco Use   Smoking status: Former    Current packs/day: 0.00    Types: Cigarettes    Quit date: 12/24/2012    Years since quitting: 10.3   Smokeless tobacco: Never  Vaping Use   Vaping status: Never Used  Substance and Sexual Activity   Alcohol use: Yes    Alcohol/week: 1.0 standard drink of alcohol    Types: 1 Glasses of wine per week    Comment: 1glass of wine monthly   Drug use: No   Sexual activity: Yes    Birth control/protection: I.U.D.    Comment: paraguard-inserted  05/2020  Other Topics Concern   Not on file  Social History Narrative   Not on file   Social Drivers of Health   Financial Resource Strain: Not on file  Food Insecurity: Not on file  Transportation Needs: Not on file  Physical Activity: Not on file  Stress: Not on file  Social Connections: Not on file  Intimate Partner Violence: Not on file   Family Status  Relation Name Status   Mother  Alive   Father  Alive   Brother  Alive   Brother  Alive   MGM Lanora Manis Bumpus Deceased   MGF Laury Deep Deceased   PGM  Deceased   PGF  Alive  No partnership data on file   Family History  Problem Relation Age of Onset   Depression Mother    Mental illness Mother    Heart defect Brother    Cancer Maternal Grandmother 45       colon   Early death Maternal Grandmother     Alcohol abuse Maternal Grandfather    AAA (abdominal aortic aneurysm) Paternal Grandmother    Dementia Paternal Grandmother    No Active Allergies    Review of Systems  Constitutional:  Negative for fever and malaise/fatigue.  HENT:  Negative for congestion.   Eyes:  Negative for blurred vision.  Respiratory:  Negative for cough and shortness of breath.   Cardiovascular:  Negative for chest pain, palpitations and leg swelling.  Gastrointestinal:  Negative for vomiting.       Loose stool   Musculoskeletal:  Negative for back pain.  Skin:  Negative for rash.  Neurological:  Negative for loss of consciousness and headaches.      Objective:     BP 100/60 (BP Location: Right Arm, Patient Position: Sitting, Cuff Size: Large)   Pulse 70   Temp 98.2 F (36.8 C) (Oral)   Resp 18   Ht 5\' 6"  (1.676 m)   Wt 230 lb (104.3 kg)   SpO2 98%   BMI 37.12 kg/m  BP Readings from Last 3 Encounters:  05/08/23 100/60  05/26/22 120/84  09/02/21 118/78   Wt Readings from Last 3 Encounters:  05/08/23 230 lb (104.3 kg)  05/26/22 218 lb 6.4 oz (99.1 kg)  09/02/21 216 lb 12.8 oz (98.3 kg)   SpO2 Readings from Last 3 Encounters:  05/08/23 98%  05/26/22 99%  09/02/21 98%      Physical Exam Vitals and nursing note reviewed.  Constitutional:      General: She is not in acute distress.    Appearance: Normal appearance. She is well-developed.  HENT:     Head: Normocephalic and atraumatic.  Eyes:     General: No scleral icterus.       Right eye: No discharge.        Left eye: No discharge.  Cardiovascular:     Rate and Rhythm: Normal rate and regular rhythm.     Heart sounds: No murmur heard. Pulmonary:     Effort: Pulmonary effort is normal. No respiratory distress.     Breath sounds: Normal breath sounds.  Genitourinary:    Rectum: Normal. Guaiac result negative.     Comments: Rectum normal  Rectal exam--- brown stool, no blood  No other abnormalities seen  Musculoskeletal:         General: Normal range of motion.     Cervical back: Normal range of motion and neck supple.     Right lower leg: No edema.  Left lower leg: No edema.  Skin:    General: Skin is warm and dry.  Neurological:     Mental Status: She is alert and oriented to person, place, and time.  Psychiatric:        Mood and Affect: Mood normal.        Behavior: Behavior normal.        Thought Content: Thought content normal.        Judgment: Judgment normal.      No results found for any visits on 05/08/23.  Last CBC Lab Results  Component Value Date   WBC 9.2 05/26/2022   HGB 14.1 05/26/2022   HCT 41.7 05/26/2022   MCV 84.9 05/26/2022   MCH 29.0 11/03/2020   RDW 13.6 05/26/2022   PLT 263.0 05/26/2022   Last metabolic panel Lab Results  Component Value Date   GLUCOSE 96 05/26/2022   NA 138 05/26/2022   K 4.2 05/26/2022   CL 104 05/26/2022   CO2 27 05/26/2022   BUN 11 05/26/2022   CREATININE 0.70 05/26/2022   GFR 113.56 05/26/2022   CALCIUM 9.4 05/26/2022   PROT 7.1 05/26/2022   ALBUMIN 4.6 05/26/2022   LABGLOB 2.8 05/13/2019   AGRATIO 1.5 05/13/2019   BILITOT 0.3 05/26/2022   ALKPHOS 51 05/26/2022   AST 18 05/26/2022   ALT 18 05/26/2022   ANIONGAP 11 11/02/2020   Last lipids Lab Results  Component Value Date   CHOL 150 05/26/2022   HDL 50.90 05/26/2022   LDLCALC 80 05/26/2022   TRIG 91.0 05/26/2022   CHOLHDL 3 05/26/2022   Last hemoglobin A1c No results found for: "HGBA1C" Last thyroid functions Lab Results  Component Value Date   TSH 1.34 05/23/2021   Last vitamin D No results found for: "25OHVITD2", "25OHVITD3", "VD25OH" Last vitamin B12 and Folate No results found for: "VITAMINB12", "FOLATE"    The ASCVD Risk score (Arnett DK, et al., 2019) failed to calculate for the following reasons:   The 2019 ASCVD risk score is only valid for ages 89 to 67    Assessment & Plan:   Problem List Items Addressed This Visit   None Visit Diagnoses        Abnormal bowel movement    -  Primary   Relevant Orders   Ova and parasite examination     Assessment and Plan    Possible parasitic infection   She reports loose stools with white pieces, raising concern for a possible parasitic infection. There is no recent travel history, but her mother recently returned from [place]. No visible worms or movement in the stool. She has no associated symptoms such as fever, abdominal pain, or rectal itching. Differential diagnosis includes undigested food particles, such as nuts, which she consumed recently. Informed consent obtained for a rectal examination to check for visible signs of parasitic infection. Perform rectal examination. Provide a stool sample kit for further analysis if white pieces are observed again in the stool.    Doubt parasite / worm    No follow-ups on file.    Donato Schultz, DO

## 2023-05-28 ENCOUNTER — Ambulatory Visit (INDEPENDENT_AMBULATORY_CARE_PROVIDER_SITE_OTHER): Payer: BC Managed Care – PPO | Admitting: Nurse Practitioner

## 2023-05-28 ENCOUNTER — Encounter: Payer: Self-pay | Admitting: Nurse Practitioner

## 2023-05-28 ENCOUNTER — Encounter (INDEPENDENT_AMBULATORY_CARE_PROVIDER_SITE_OTHER): Payer: Self-pay

## 2023-05-28 VITALS — BP 118/72 | HR 82 | Temp 98.0°F | Ht 66.0 in | Wt 229.0 lb

## 2023-05-28 DIAGNOSIS — E663 Overweight: Secondary | ICD-10-CM | POA: Insufficient documentation

## 2023-05-28 DIAGNOSIS — Z0001 Encounter for general adult medical examination with abnormal findings: Secondary | ICD-10-CM

## 2023-05-28 DIAGNOSIS — Z136 Encounter for screening for cardiovascular disorders: Secondary | ICD-10-CM | POA: Diagnosis not present

## 2023-05-28 DIAGNOSIS — E66812 Obesity, class 2: Secondary | ICD-10-CM | POA: Insufficient documentation

## 2023-05-28 DIAGNOSIS — Z6836 Body mass index (BMI) 36.0-36.9, adult: Secondary | ICD-10-CM | POA: Diagnosis not present

## 2023-05-28 DIAGNOSIS — E6609 Other obesity due to excess calories: Secondary | ICD-10-CM

## 2023-05-28 DIAGNOSIS — Z13220 Encounter for screening for lipoid disorders: Secondary | ICD-10-CM

## 2023-05-28 DIAGNOSIS — E669 Obesity, unspecified: Secondary | ICD-10-CM | POA: Insufficient documentation

## 2023-05-28 LAB — COMPREHENSIVE METABOLIC PANEL WITH GFR
ALT: 31 U/L (ref 0–35)
AST: 20 U/L (ref 0–37)
Albumin: 4.6 g/dL (ref 3.5–5.2)
Alkaline Phosphatase: 67 U/L (ref 39–117)
BUN: 13 mg/dL (ref 6–23)
CO2: 29 meq/L (ref 19–32)
Calcium: 9.2 mg/dL (ref 8.4–10.5)
Chloride: 101 meq/L (ref 96–112)
Creatinine, Ser: 0.66 mg/dL (ref 0.40–1.20)
GFR: 114.37 mL/min (ref >60.00)
Glucose, Bld: 79 mg/dL (ref 70–99)
Potassium: 4.4 meq/L (ref 3.5–5.1)
Sodium: 137 meq/L (ref 135–145)
Total Bilirubin: 0.5 mg/dL (ref 0.2–1.2)
Total Protein: 7.3 g/dL (ref 6.0–8.3)

## 2023-05-28 LAB — TSH: TSH: 1.94 u[IU]/mL (ref 0.35–5.50)

## 2023-05-28 LAB — VITAMIN D 25 HYDROXY (VIT D DEFICIENCY, FRACTURES): VITD: 26.03 ng/mL — ABNORMAL LOW (ref 30.00–100.00)

## 2023-05-28 LAB — LIPID PANEL
Cholesterol: 150 mg/dL (ref 0–200)
HDL: 58.6 mg/dL (ref >39.00)
LDL Cholesterol: 78 mg/dL (ref 0–99)
NonHDL: 91.58
Total CHOL/HDL Ratio: 3
Triglycerides: 66 mg/dL (ref 0.0–149.0)
VLDL: 13.2 mg/dL (ref 0.0–40.0)

## 2023-05-28 LAB — HEMOGLOBIN A1C: Hgb A1c MFr Bld: 5.2 % (ref 4.6–6.5)

## 2023-05-28 NOTE — Progress Notes (Signed)
 Complete physical exam  Patient: Misty Wright   DOB: 18-Dec-1990   32 y.o. Female  MRN: [id number] Visit Date: 05/28/2023  Subjective:    Chief Complaint  Patient presents with   Annual Exam   Misty Wright is a 33 y.o. female who presents today for a complete physical exam. She reports consuming a general diet.  Walking 2x/week to 1hrs, video exercise 4-5x/week, 20-1mins kick box and dance video  She generally feels well. She reports sleeping well. She does have additional problems to discuss today.  Vision:No Dental:Yes STD Screen:No  BP Readings from Last 3 Encounters:  05/28/23 118/72  05/08/23 100/60  05/26/22 120/84   Wt Readings from Last 3 Encounters:  05/28/23 229 lb (103.9 kg)  05/08/23 230 lb (104.3 kg)  05/26/22 218 lb 6.4 oz (99.1 kg)   Most recent fall risk assessment:    05/28/2023    8:12 AM  Fall Risk   Falls in the past year? 0  Number falls in past yr: 0  Injury with Fall? 0  Risk for fall due to : No Fall Risks  Follow up Falls evaluation completed    Depression screen:Yes - Depression Most recent depression screenings:    05/28/2023    8:12 AM 05/26/2022    1:36 PM  PHQ 2/9 Scores  PHQ - 2 Score 1 1  PHQ- 9 Score 3 1      05/28/2023    8:12 AM 05/26/2022    1:36 PM 05/23/2021    1:40 PM  Depression screen PHQ 2/9  Decreased Interest 0 1 0  Down, Depressed, Hopeless 1 0 1  PHQ - 2 Score 1 1 1   Altered sleeping 0 0 0  Tired, decreased energy 1  1  Change in appetite 1 0 1  Feeling bad or failure about yourself  0 0 1  Trouble concentrating 0 0 0  Moving slowly or fidgety/restless 0 0 0  Suicidal thoughts 0 0 0  PHQ-9 Score 3 1 4   Difficult doing work/chores Not difficult at all Not difficult at all Somewhat difficult       05/28/2023    8:12 AM 05/26/2022    1:37 PM 05/23/2021    1:40 PM  GAD 7 : Generalized Anxiety Score  Nervous, Anxious, on Edge 1 1 1   Control/stop worrying 2 1 1   Worry too much - different things 2  1 1   Trouble relaxing 2 1 1   Restless 0 0 0  Easily annoyed or irritable 2 1 1   Afraid - awful might happen 0 0 0  Total GAD 7 Score 9 5 5   Anxiety Difficulty Somewhat difficult Not difficult at all Somewhat difficult   HPI  Class 2 obesity due to excess calories without serious comorbidity with body mass index (BMI) of 36.0 to 36.9 in adult Reports difficulty with weight loss Was able to loss about 20lbs last year by limiting daily caloric intake  to 1600kcal, but noticed weight gain after discontinuation. Exercise: cardio (walking and home video) 2-4x/week Wt Readings from Last 3 Encounters:  05/28/23 229 lb (103.9 kg)  05/08/23 230 lb (104.3 kg)  05/26/22 218 lb 6.4 oz (99.1 kg)    She agreed to weight management clinic referral Check vit. D, THYROID, hgbA1c and lipid panel   Past Medical History:  Diagnosis Date   Abdominal cramping 09/02/2021   Allergy 2016   Mold or dust   Nasal congestion 09/02/2021   Postpartum depression  Pyelonephritis    Past Surgical History:  Procedure Laterality Date   APPENDECTOMY  2022   arm surgery Left    age 57   FRACTURE SURGERY  1997   LAPAROSCOPIC APPENDECTOMY N/A 11/02/2020   Procedure: APPENDECTOMY LAPAROSCOPIC;  Surgeon: Andria Meuse, MD;  Location: [facility] OR;  Service: General;  Laterality: N/A;   WISDOM TOOTH EXTRACTION     Social History   Socioeconomic History   Marital status: Married    Spouse name: Not on file   Number of children: 3   Years of education: Not on file   Highest education level: Not on file  Occupational History   Not on file  Tobacco Use   Smoking status: Former    Current packs/day: 0.00    Types: Cigarettes    Quit date: 12/24/2012    Years since quitting: 10.4   Smokeless tobacco: Never  Vaping Use   Vaping status: Never Used  Substance and Sexual Activity   Alcohol use: Yes    Alcohol/week: 1.0 standard drink of alcohol    Types: 1 Glasses of wine per week    Comment: 1glass of  wine monthly   Drug use: No   Sexual activity: Yes    Birth control/protection: I.U.D.    Comment: paraguard-inserted 05/2020  Other Topics Concern   Not on file  Social History Narrative   Not on file   Social Drivers of Health   Financial Resource Strain: Not on file  Food Insecurity: Not on file  Transportation Needs: Not on file  Physical Activity: Not on file  Stress: Not on file  Social Connections: Not on file  Intimate Partner Violence: Not on file   Family Status  Relation Name Status   Mother  Alive   Father  Alive   Brother  Alive   Brother  Alive   MGM [place] Deceased   MGF [place] Deceased   PGM  Deceased   PGF  Alive  No partnership data on file   Family History  Problem Relation Age of Onset   Depression Mother    Mental illness Mother    Heart defect Brother    Cancer Maternal Grandmother 32       colon   Alcohol abuse Maternal Grandfather    AAA (abdominal aortic aneurysm) Paternal Grandmother    Dementia Paternal Grandmother    No Active Allergies  Patient Care Team: Alessandro Griep, Bonna Gains, NP as PCP - General (Internal Medicine) Ob/Gyn, Nestor Ramp   Medications: Outpatient Medications Prior to Visit  Medication Sig   Prenatal Vit-Fe Fumarate-FA (MULTIVITAMIN-PRENATAL) 27-0.8 MG TABS tablet Take 1 tablet by mouth daily at 12 noon.   No facility-administered medications prior to visit.    Review of Systems  Constitutional:  Negative for activity change, appetite change and unexpected weight change.  Respiratory: Negative.    Cardiovascular: Negative.   Gastrointestinal: Negative.   Endocrine: Negative for cold intolerance and heat intolerance.  Genitourinary: Negative.   Musculoskeletal: Negative.   Skin: Negative.   Neurological: Negative.   Hematological: Negative.   Psychiatric/Behavioral:  Negative for behavioral problems, decreased concentration, dysphoric mood, hallucinations, self-injury, sleep disturbance and  suicidal ideas. The patient is not nervous/anxious.        Objective:  BP 118/72 (BP Location: Left Arm, Patient Position: Sitting, Cuff Size: Large)   Pulse 82   Temp 98 F (36.7 C) (Temporal)   Ht 5\' 6"  (1.676 m)   Wt 229 lb (103.9  kg)   SpO2 98%   Breastfeeding No   BMI 36.96 kg/m     Physical Exam Vitals and nursing note reviewed.  Constitutional:      General: She is not in acute distress. HENT:     Right Ear: Tympanic membrane, ear canal and external ear normal.     Left Ear: Tympanic membrane, ear canal and external ear normal.     Nose: Nose normal.  Eyes:     Extraocular Movements: Extraocular movements intact.     Conjunctiva/sclera: Conjunctivae normal.     Pupils: Pupils are equal, round, and reactive to light.  Neck:     Thyroid: No thyroid mass, thyromegaly or thyroid tenderness.  Cardiovascular:     Rate and Rhythm: Normal rate and regular rhythm.     Pulses: Normal pulses.     Heart sounds: Normal heart sounds.  Pulmonary:     Effort: Pulmonary effort is normal.     Breath sounds: Normal breath sounds.  Abdominal:     General: Bowel sounds are normal.     Palpations: Abdomen is soft.  Musculoskeletal:        General: Normal range of motion.     Cervical back: Normal range of motion and neck supple.     Right lower leg: No edema.     Left lower leg: No edema.  Lymphadenopathy:     Cervical: No cervical adenopathy.  Skin:    General: Skin is warm and dry.  Neurological:     Mental Status: She is alert and oriented to person, place, and time.     Cranial Nerves: No cranial nerve deficit.  Psychiatric:        Mood and Affect: Mood normal.        Behavior: Behavior normal.        Thought Content: Thought content normal.      No results found for any visits on 05/28/23.    Assessment & Plan:    Routine Health Maintenance and Physical Exam  Immunization History  Administered Date(s) Administered   DTaP 01/22/1989, 03/30/1989, 05/26/1989,  05/21/1990   Hepatitis A 02/05/2016   Hepatitis A, Adult 02/05/2016   Hepatitis A, Ped/Adol-2 Dose 02/05/2016   Hepatitis B 01/22/1989, 03/30/1989, 01/07/2011   Hepatitis B, PED/ADOLESCENT 01/22/1989, 03/30/1989, 01/07/2011   IPV 01/22/1989, 03/30/1989, 05/21/1990   Influenza Inj Mdck Quad Pf 01/11/2020   Influenza Inj Mdck Quad With Preservative 12/13/2016   Influenza Split 12/07/2014, 12/11/2017   Influenza,inj,Quad PF,6+ Mos 12/07/2014, 12/11/2017   Influenza-Unspecified 12/07/2014, 12/11/2017, 01/11/2020, 12/03/2020   Janssen (J&J) SARS-COV-2 Vaccination 06/02/2019   MMR 02/23/1990, 02/05/2016   PFIZER(Purple Top)SARS-COV-2 Vaccination 03/28/2019, 04/24/2019   PPD Test 07/25/2015   Tdap 08/20/2011, 02/08/2020    Health Maintenance  Topic Date Due   Cervical Cancer Screening (HPV/Pap Cotest)  10/12/2021   COVID-19 Vaccine (3 - Pfizer risk series) 06/13/2023 (Originally 06/30/2019)   INFLUENZA VACCINE  09/25/2023   DTaP/Tdap/Td (7 - Td or Tdap) 02/07/2030   Hepatitis C Screening  Completed   HIV Screening  Completed   HPV VACCINES  Aged Out    Discussed health benefits of physical activity, and encouraged her to engage in regular exercise appropriate for her age and condition.  Problem List Items Addressed This Visit     Class 2 obesity due to excess calories without serious comorbidity with body mass index (BMI) of 36.0 to 36.9 in adult   Reports difficulty with weight loss Was able to loss about 20lbs  last year by limiting daily caloric intake  to 1600kcal, but noticed weight gain after discontinuation. Exercise: cardio (walking and home video) 2-4x/week Wt Readings from Last 3 Encounters:  05/28/23 229 lb (103.9 kg)  05/08/23 230 lb (104.3 kg)  05/26/22 218 lb 6.4 oz (99.1 kg)    She agreed to weight management clinic referral Check vit. D, THYROID, hgbA1c and lipid panel      Relevant Orders   TSH   Hemoglobin A1c   Amb Ref to Medical Weight Management   VITAMIN  D 25 Hydroxy (Vit-D Deficiency, Fractures)   Lipid panel   Other Visit Diagnoses       Encounter for preventative adult health care exam with abnormal findings    -  Primary   Relevant Orders   Comprehensive metabolic panel with GFR     Encounter for lipid screening for cardiovascular disease       Relevant Orders   Lipid panel      Return in about 1 year (around 05/27/2024) for CPE (fasting).     Alysia Penna, NP

## 2023-05-28 NOTE — Assessment & Plan Note (Addendum)
 Reports difficulty with weight loss Was able to loss about 20lbs last year by limiting daily caloric intake  to 1600kcal, but noticed weight gain after discontinuation. Exercise: cardio (walking and home video) 2-4x/week Wt Readings from Last 3 Encounters:  05/28/23 229 lb (103.9 kg)  05/08/23 230 lb (104.3 kg)  05/26/22 218 lb 6.4 oz (99.1 kg)    She agreed to weight management clinic referral Check vit. D, THYROID, hgbA1c and lipid panel

## 2023-05-28 NOTE — Patient Instructions (Addendum)
 Ask insurance if services from healthy weight management clinic is covered (with [facility]) Go to lab Maintain Heart healthy diet and daily exercise.  Obesity, Adult Obesity is the condition of having too much total body fat. Being overweight or obese means that your weight is greater than what is considered healthy for your body size. Obesity is determined by a measurement called BMI (body mass index). BMI is an estimate of body fat and is calculated from height and weight. For adults, a BMI of 30 or higher is considered obese. Obesity can lead to other health concerns and major illnesses, including: Stroke. Coronary artery disease (CAD). Type 2 diabetes. Some types of cancer, including cancers of the colon, breast, uterus, and gallbladder. High blood pressure (hypertension). High cholesterol. Gallbladder stones. Obesity can also contribute to: Osteoarthritis. Sleep apnea. Infertility problems. What are the causes? Common causes of this condition include: Eating daily meals that are high in calories, sugar, and fat. Drinking high amounts of sugar-sweetened beverages, such as soft drinks. Being born with genes that may make you more likely to become obese. Having a medical condition that causes obesity, including: Hypothyroidism. Polycystic ovarian syndrome (PCOS). Binge-eating disorder. Cushing syndrome. Taking certain medicines, such as steroids, antidepressants, and seizure medicines. Not being physically active (sedentary lifestyle). Not getting enough sleep. What increases the risk? The following factors may make you more likely to develop this condition: Having a family history of obesity. Living in an area with limited access to: [place], recreation centers, or sidewalks. Healthy food choices, such as grocery stores and farmers' markets. What are the signs or symptoms? The main sign of this condition is having too much body fat. How is this diagnosed? This  condition is diagnosed based on: Your BMI. If you are an adult with a BMI of 30 or higher, you are considered obese. Your waist circumference. This measures the distance around your waistline. Your skinfold thickness. Your health care provider may gently pinch a fold of your skin and measure it. You may have other tests to check for underlying conditions. How is this treated? Treatment for this condition often includes changing your lifestyle. Treatment may include some or all of the following: Dietary changes. This may include developing a healthy meal plan. Regular physical activity. This may include activity that causes your heart to beat faster (aerobic exercise) and strength training. Work with your health care provider to design an exercise program that works for you. Medicine to help you lose weight if you are unable to lose one pound a week after six weeks of healthy eating and more physical activity. Treating conditions that cause the obesity (underlying conditions). Surgery. Surgical options may include gastric banding and gastric bypass. Surgery may be done if: Other treatments have not helped to improve your condition. You have a BMI of 40 or higher. You have life-threatening health problems related to obesity. Follow these instructions at home: Eating and drinking  Follow recommendations from your health care provider about what you eat and drink. Your health care provider may advise you to: Limit fast food, sweets, and processed snack foods. Choose low-fat options, such as low-fat milk instead of whole milk. Eat five or more servings of fruits or vegetables every day. Choose healthy foods when you eat out. Keep low-fat snacks available. Limit sugary drinks, such as soda, fruit juice, sweetened iced tea, and flavored milk. Drink enough water to keep your urine pale yellow. Do not follow a fad diet. Fad diets can be  unhealthy and even dangerous. Other healthful choices  include: Eat at home more often. This gives you more control over what you eat. Learn to read food labels. This will help you understand how much food is considered one serving. Learn what a healthy serving size is. Physical activity Exercise regularly, as told by your health care provider. Most adults should get up to 150 minutes of moderate-intensity exercise every week. Ask your health care provider what types of exercise are safe for you and how often you should exercise. Warm up and stretch before being active. Cool down and stretch after being active. Rest between periods of activity. Lifestyle Work with your health care provider and a dietitian to set a weight-loss goal that is healthy and reasonable for you. Limit your screen time. Find ways to reward yourself that do not involve food. Do not drink alcohol if: Your health care provider tells you not to drink. You are pregnant, may be pregnant, or are planning to become pregnant. If you drink alcohol: Limit how much you have to: 0-1 drink a day for women. 0-2 drinks a day for men. Know how much alcohol is in your drink. In the [place] one drink equals one 12 oz bottle of beer (355 mL), one 5 oz glass of wine (148 mL), or one 1 oz glass of hard liquor (44 mL). General instructions Keep a weight-loss journal to keep track of the food you eat and how much exercise you get. Take over-the-counter and prescription medicines only as told by your health care provider. Take vitamins and supplements only as told by your health care provider. Consider joining a support group. Your health care provider may be able to recommend a support group. Pay attention to your mental health as obesity can lead to depression or self esteem issues. Keep all follow-up visits. This is important. Contact a health care provider if: You are unable to meet your weight-loss goal after six weeks of dietary and lifestyle changes. You have trouble  breathing. Summary Obesity is the condition of having too much total body fat. Being overweight or obese means that your weight is greater than what is considered healthy for your body size. Work with your health care provider and a dietitian to set a weight-loss goal that is healthy and reasonable for you. Exercise regularly, as told by your health care provider. Ask your health care provider what types of exercise are safe for you and how often you should exercise. This information is not intended to replace advice given to you by your health care provider. Make sure you discuss any questions you have with your health care provider. Document Revised: 09/18/2020 Document Reviewed: 09/18/2020 Elsevier Patient Education  2024 [employer].

## 2023-05-30 DIAGNOSIS — Z713 Dietary counseling and surveillance: Secondary | ICD-10-CM | POA: Diagnosis not present

## 2023-05-30 DIAGNOSIS — E669 Obesity, unspecified: Secondary | ICD-10-CM | POA: Diagnosis not present

## 2023-05-31 ENCOUNTER — Encounter: Payer: Self-pay | Admitting: Nurse Practitioner

## 2023-06-03 ENCOUNTER — Other Ambulatory Visit (HOSPITAL_COMMUNITY): Payer: Self-pay

## 2023-06-03 ENCOUNTER — Telehealth: Payer: Self-pay | Admitting: Pharmacist

## 2023-06-03 MED ORDER — WEGOVY 0.25 MG/0.5ML ~~LOC~~ SOAJ: 0.2500 mg | SUBCUTANEOUS | 0 refills | Status: DC

## 2023-06-03 NOTE — Addendum Note (Signed)
 Addended by: Michaela Corner on: 06/03/2023 09:18 AM   Modules accepted: Orders

## 2023-06-03 NOTE — Telephone Encounter (Signed)
 Pharmacy Patient Advocate Encounter   Received notification from Patient Pharmacy that prior authorization for [facility] 0.25MG /0.5ML auto-injectors is required/requested.   Insurance verification completed.   The patient is insured through [facility] .   Per test claim: PA required; PA submitted to above mentioned insurance via CoverMyMeds Key/confirmation #/EOC  [facility] Status is pending

## 2023-06-04 ENCOUNTER — Other Ambulatory Visit (HOSPITAL_COMMUNITY): Payer: Self-pay

## 2023-06-04 NOTE — Telephone Encounter (Signed)
 Pharmacy Patient Advocate Encounter  Received notification from [facility] that Prior Authorization for Wegovy 0.25MG /0.5ML auto-injectors has been APPROVED from 06/04/23 to 11/30/23. Unable to obtain price due to refill too soon rejection, last fill date 06/03/23 next available fill date04/30/25   PA #/Case ID/Reference #: [facility]

## 2023-06-08 DIAGNOSIS — Z713 Dietary counseling and surveillance: Secondary | ICD-10-CM | POA: Diagnosis not present

## 2023-06-08 DIAGNOSIS — E669 Obesity, unspecified: Secondary | ICD-10-CM | POA: Diagnosis not present

## 2023-06-17 DIAGNOSIS — Z713 Dietary counseling and surveillance: Secondary | ICD-10-CM | POA: Diagnosis not present

## 2023-06-17 DIAGNOSIS — E669 Obesity, unspecified: Secondary | ICD-10-CM | POA: Diagnosis not present

## 2023-07-02 ENCOUNTER — Ambulatory Visit: Admitting: Nurse Practitioner

## 2023-07-02 VITALS — BP 108/62 | HR 76 | Temp 97.7°F | Ht 66.0 in | Wt 219.0 lb

## 2023-07-02 DIAGNOSIS — E66812 Obesity, class 2: Secondary | ICD-10-CM

## 2023-07-02 DIAGNOSIS — Z6835 Body mass index (BMI) 35.0-35.9, adult: Secondary | ICD-10-CM

## 2023-07-02 DIAGNOSIS — E6609 Other obesity due to excess calories: Secondary | ICD-10-CM | POA: Diagnosis not present

## 2023-07-02 LAB — COMPREHENSIVE METABOLIC PANEL WITH GFR
ALT: 17 U/L (ref 0–35)
AST: 20 U/L (ref 0–37)
Albumin: 4.7 g/dL (ref 3.5–5.2)
Alkaline Phosphatase: 45 U/L (ref 39–117)
BUN: 8 mg/dL (ref 6–23)
CO2: 28 meq/L (ref 19–32)
Calcium: 9.5 mg/dL (ref 8.4–10.5)
Chloride: 102 meq/L (ref 96–112)
Creatinine, Ser: 0.66 mg/dL (ref 0.40–1.20)
GFR: 114.3 mL/min (ref >60.00)
Glucose, Bld: 79 mg/dL (ref 70–99)
Potassium: 4.3 meq/L (ref 3.5–5.1)
Sodium: 139 meq/L (ref 135–145)
Total Bilirubin: 0.3 mg/dL (ref 0.2–1.2)
Total Protein: 7.6 g/dL (ref 6.0–8.3)

## 2023-07-02 MED ORDER — WEGOVY 0.5 MG/0.5ML ~~LOC~~ SOAJ
0.5000 mg | SUBCUTANEOUS | 0 refills | Status: DC
Start: 2023-07-02 — End: 2023-07-22

## 2023-07-02 NOTE — Assessment & Plan Note (Addendum)
 Lost 10lbs in last 38month with wegovy  0.25mg  Denies any adverse effects. Diet: modified under the guidance of a nutritionist Exercise: daily walking and weight training 2x/week Wt Readings from Last 3 Encounters:  07/02/23 219 lb (99.3 kg)  05/28/23 229 lb (103.9 kg)  05/08/23 230 lb (104.3 kg)    Repeat CMP Increase wegovy  dose to 0.5mg  weekly F/up in 38months

## 2023-07-02 NOTE — Progress Notes (Signed)
                Established Patient Visit  Patient: Misty Wright   DOB: 1990-06-29   33 y.o. Female  MRN: [id number] Visit Date: 07/02/2023  Subjective:     Chief Complaint  Patient presents with   Medication Management    Follow up for Wegovy     HPI Class 2 obesity due to excess calories without serious comorbidity with body mass index (BMI) of 36.0 to 36.9 in adult Lost 10lbs in last 339month with wegovy  0.25mg  Denies any adverse effects. Diet: modified under the guidance of a nutritionist Exercise: daily walking and weight training 2x/week Wt Readings from Last 3 Encounters:  07/02/23 219 lb (99.3 kg)  05/28/23 229 lb (103.9 kg)  05/08/23 230 lb (104.3 kg)    Repeat CMP Increase wegovy  dose to 0.5mg  weekly F/up in 355months   Reviewed medical, surgical, and social history today  Medications: Outpatient Medications Prior to Visit  Medication Sig   Prenatal Vit-Fe Fumarate-FA (MULTIVITAMIN-PRENATAL) 27-0.8 MG TABS tablet Take 1 tablet by mouth daily at 12 noon.   Probiotic Product (PROBIOTIC DAILY PO) Take 1 tablet by mouth daily.   VITAMIN D  PO Take 1 capsule by mouth daily.   [DISCONTINUED] Semaglutide -Weight Management (WEGOVY ) 0.25 MG/0.5ML SOAJ Inject 0.25 mg into the skin once a week.   No facility-administered medications prior to visit.   Reviewed past medical and social history.   ROS per HPI above      Objective:  BP 108/62 (BP Location: Left Arm, Patient Position: Sitting, Cuff Size: Large)   Pulse 76   Temp 97.7 F (36.5 C) (Temporal)   Ht 5\' 6"  (1.676 m)   Wt 219 lb (99.3 kg)   SpO2 98%   BMI 35.35 kg/m      Physical Exam Vitals and nursing note reviewed.  Cardiovascular:     Rate and Rhythm: Normal rate and regular rhythm.     Pulses: Normal pulses.  Pulmonary:     Effort: Pulmonary effort is normal.  Abdominal:     General: Bowel sounds are normal. There is no distension.     Palpations: Abdomen is soft.     Tenderness: There is no  abdominal tenderness. There is no right CVA tenderness, left CVA tenderness or guarding.  Neurological:     Mental Status: She is alert and oriented to person, place, and time.     No results found for any visits on 07/02/23.    Assessment & Plan:    Problem List Items Addressed This Visit     Class 2 obesity due to excess calories without serious comorbidity with body mass index (BMI) of 36.0 to 36.9 in adult - Primary   Lost 10lbs in last 339month with wegovy  0.25mg  Denies any adverse effects. Diet: modified under the guidance of a nutritionist Exercise: daily walking and weight training 2x/week Wt Readings from Last 3 Encounters:  07/02/23 219 lb (99.3 kg)  05/28/23 229 lb (103.9 kg)  05/08/23 230 lb (104.3 kg)    Repeat CMP Increase wegovy  dose to 0.5mg  weekly F/up in 355months      Relevant Medications   WEGOVY  0.5 MG/0.5ML SOAJ   Return in about 4 weeks (around 07/30/2023) for Weight management.     Kathrene Parents, NP

## 2023-07-02 NOTE — Patient Instructions (Signed)
 Go to lab Maintain Heart healthy diet and daily exercise.  How to Increase Your Level of Physical Activity Getting regular physical activity is important for your overall health and well-being. Most people do not get enough exercise. There are easy ways to increase your level of physical activity, even if you have not been very active in the past or if you are just starting out. What are the benefits of physical activity? Physical activity has many short-term and long-term benefits. Being active on a regular basis can improve your physical and mental health as well as provide other benefits. Physical health benefits Helping you lose weight or maintain a healthy weight. Strengthening your muscles and bones. Reducing your risk of certain long-term (chronic) diseases, including heart disease, cancer, and diabetes. Being able to move around more easily and for longer periods of time without getting tired (increased endurance or stamina). Improving your ability to fight off illness (enhanced immunity). Being able to sleep better. Helping you stay healthy as you get older, including: Helping you stay mobile, or capable of walking and moving around. Preventing accidents, such as falls. Increasing life expectancy. Mental health benefits Boosting your mood and improving your self-esteem. Lowering your chance of having mental health problems, such as depression or anxiety. Helping you feel good about your body. Other benefits Finding new sources of fun and enjoyment. Meeting new people who share a common interest. Before you begin If you have a chronic illness or have not been active for a while, check with your health care provider about how to get started. Ask your health care provider what activities are safe for you. Start out slowly. Walking or doing some simple chair exercises is a good place to start, especially if you have not been active before or for a long time. Set goals that you can  work toward. Ask your health care provider how much exercise is best for you. In general, most adults should: Do moderate-intensity exercise for at least 150 minutes each week (30 minutes on most days of the week) or vigorous exercise for at least 75 minutes each week, or a combination of these. Moderate-intensity exercise can include walking at a quick pace, biking, yoga, water aerobics, or gardening. Vigorous exercise involves activities that take more effort, such as jogging or running, playing sports, swimming laps, or jumping rope. Do strength exercises on at least 2 days each week. This can include weight lifting, body weight exercises, and resistance-band exercises. How to be more physically active Make a plan  Try to find activities that you enjoy. You are more likely to commit to an exercise routine if it does not feel like a chore. If you have bone or joint problems, choose low-impact exercises, like walking or swimming. Use these tips for being successful with an exercise plan: Find a workout partner for accountability. Join a group or class, such as an aerobics class, cycling class, or sports team. Make family time active. Go for a walk, bike, or swim. Include a variety of exercises each week. Consider using a fitness tracker, such as a mobile phone app or a device worn like a watch, that will count the number of steps you take each day. Many people strive to reach 10,000 steps a day. Find ways to be active in your daily routines Besides your formal exercise plans, you can find ways to do physical activity during your daily routines, such as: Walking or biking to work or to the store. Taking the stairs instead  of the elevator. Parking farther away from the door at work or at the store. Planning walking meetings. Walking around while you are on the phone. Where to find more information Centers for Disease Control and Prevention: [employer].pt President's Council on  Fitness, Sports & Nutrition: www.fitness.gov ChooseMyPlate: http://www.harvey.com/ Contact a health care provider if: You have headaches, muscle aches, or joint pain that is concerning. You feel dizzy or light-headed while exercising. You faint. You feel your heart skipping, racing, or fluttering. You have chest pain while exercising. Summary Exercise benefits your mind and body at any age, even if you are just starting out. If you have a chronic illness or have not been active for a while, check with your health care provider before increasing your physical activity. Choose activities that are safe and enjoyable for you. Ask your health care provider what activities are safe for you. Start slowly. Tell your health care provider if you have problems as you start to increase your activity level. This information is not intended to replace advice given to you by your health care provider. Make sure you discuss any questions you have with your health care provider. Document Revised: 06/08/2020 Document Reviewed: 06/08/2020 Elsevier Patient Education  2024 [employer].

## 2023-07-04 ENCOUNTER — Encounter: Payer: Self-pay | Admitting: Nurse Practitioner

## 2023-07-06 DIAGNOSIS — Z713 Dietary counseling and surveillance: Secondary | ICD-10-CM | POA: Diagnosis not present

## 2023-07-06 DIAGNOSIS — E669 Obesity, unspecified: Secondary | ICD-10-CM | POA: Diagnosis not present

## 2023-07-13 DIAGNOSIS — Z713 Dietary counseling and surveillance: Secondary | ICD-10-CM | POA: Diagnosis not present

## 2023-07-13 DIAGNOSIS — E669 Obesity, unspecified: Secondary | ICD-10-CM | POA: Diagnosis not present

## 2023-07-20 DIAGNOSIS — E669 Obesity, unspecified: Secondary | ICD-10-CM | POA: Diagnosis not present

## 2023-07-20 DIAGNOSIS — Z713 Dietary counseling and surveillance: Secondary | ICD-10-CM | POA: Diagnosis not present

## 2023-07-22 ENCOUNTER — Telehealth: Payer: Self-pay

## 2023-07-22 DIAGNOSIS — E6609 Other obesity due to excess calories: Secondary | ICD-10-CM

## 2023-07-22 MED ORDER — WEGOVY 0.5 MG/0.5ML ~~LOC~~ SOAJ: 0.5000 mg | SUBCUTANEOUS | 0 refills | Status: DC

## 2023-07-22 NOTE — Telephone Encounter (Signed)
 Copied from CRM [id number]. Topic: General - Other >> Jul 22, 2023  1:23 PM Albertha Alosa wrote: Reason for CRM: Patient called in wanting to reschedule appointment for 06/04 , stating she know NP Kathrene Parents wanted to see her in a month but next available isnt until July , wanted to make sure that was okay before she rescheduled

## 2023-07-22 NOTE — Telephone Encounter (Signed)
 Called patient and informed her of Charlotte's comment of agreeing to appointment being rescheduled and to send in a refill of Wegovy  with no refills. Patient is rescheduled and thanked me for calling.  Refills sent to pharmacy pharmacy

## 2023-07-27 DIAGNOSIS — Z713 Dietary counseling and surveillance: Secondary | ICD-10-CM | POA: Diagnosis not present

## 2023-07-27 DIAGNOSIS — E669 Obesity, unspecified: Secondary | ICD-10-CM | POA: Diagnosis not present

## 2023-07-29 ENCOUNTER — Ambulatory Visit: Admitting: Nurse Practitioner

## 2023-08-24 ENCOUNTER — Encounter: Payer: Self-pay | Admitting: Nurse Practitioner

## 2023-08-24 DIAGNOSIS — E6609 Other obesity due to excess calories: Secondary | ICD-10-CM

## 2023-08-24 MED ORDER — WEGOVY 0.5 MG/0.5ML ~~LOC~~ SOAJ: 0.5000 mg | SUBCUTANEOUS | 0 refills | Status: DC
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
## 2023-08-24 NOTE — Addendum Note (Signed)
 Addended by: KATHEEN ROSELIE CROME on: 08/24/2023 03:29 PM   Modules accepted: Orders

## 2023-08-31 MED ORDER — WEGOVY 0.5 MG/0.5ML ~~LOC~~ SOAJ: 0.5000 mg | SUBCUTANEOUS | 0 refills | Status: DC

## 2023-08-31 NOTE — Addendum Note (Signed)
 Addended by: LENON ROUGHEN on: 08/31/2023 03:52 PM   Modules accepted: Orders

## 2023-08-31 NOTE — Telephone Encounter (Signed)
 Called the pharmacy [facility] [place] , [place]. I spoke with Newell and informed her why I was calling. She stated that patient has not picked up medication. I informed her to cancel the Rx. She confirmed that the Rx was canceled and thanked me for calling.

## 2023-08-31 NOTE — Telephone Encounter (Signed)
 Called patient to get the location of pharmacy that she is now located. [facility] [address] [address] [place]  [phone]. Informed patient that she will need to contact that pharmacy to see make sure they received the Rx. I also asked if she will be able to still have her upcoming virtual appointment with [facility] as scheduled. She stated that she would. I informed her that per Roselie she is to have a way to get her blood pressure and weight. She verbalized that she would and thanked me for calling.

## 2023-09-09 ENCOUNTER — Telehealth: Admitting: Nurse Practitioner

## 2023-09-09 VITALS — BP 115/64 | Ht 66.0 in | Wt 196.0 lb

## 2023-09-09 DIAGNOSIS — E6609 Other obesity due to excess calories: Secondary | ICD-10-CM | POA: Diagnosis not present

## 2023-09-09 DIAGNOSIS — E66811 Obesity, class 1: Secondary | ICD-10-CM | POA: Diagnosis not present

## 2023-09-09 DIAGNOSIS — Z6831 Body mass index (BMI) 31.0-31.9, adult: Secondary | ICD-10-CM

## 2023-09-09 MED ORDER — WEGOVY 0.5 MG/0.5ML ~~LOC~~ SOAJ: 0.5000 mg | SUBCUTANEOUS | 1 refills | Status: DC

## 2023-09-09 NOTE — Assessment & Plan Note (Addendum)
 Lost 23lbs in 2months Total weight loss: 33lbs in 3months with wegovy  0.5mg  Denies any adverse effects Exercise: walking, biking, weight training 5x/week Diet: mediterranean diet, daily Wt Readings from Last 3 Encounters:  09/09/23 196 lb (88.9 kg)  07/02/23 219 lb (99.3 kg)  05/28/23 229 lb (103.9 kg)    Maintain med dose Refill sent F/up in 2months (11/02/2023 a@1 :20pm)

## 2023-09-09 NOTE — Progress Notes (Signed)
 Virtual Visit via Video Note  I connected withNAME@ on 09/09/23 at  8:40 AM EDT by a video enabled telemedicine application and verified that I am speaking with the correct person using two identifiers.  Location: Patient:Home Provider: Office Participants: patient and provider  I discussed the limitations of evaluation and management by telemedicine and the availability of in person appointments. I also discussed with the patient that there may be a patient responsible charge related to this service. The patient expressed understanding and agreed to proceed.  CC: weight management  History of Present Illness: Obesity Lost 23lbs in 2months Total weight loss: 33lbs in 3months with wegovy  0.5mg  Denies any adverse effects Exercise: walking, biking, weight training 5x/week Diet: mediterranean diet, daily Wt Readings from Last 3 Encounters:  09/09/23 196 lb (88.9 kg)  07/02/23 219 lb (99.3 kg)  05/28/23 229 lb (103.9 kg)    Maintain med dose Refill sent F/up in 2months (11/02/2023 a@1 :20pm)     BP Readings from Last 3 Encounters:  09/09/23 115/64  07/02/23 108/62  05/28/23 118/72    Observations/Objective: Physical Exam Vitals and nursing note reviewed.  Pulmonary:     Effort: Pulmonary effort is normal.  Neurological:     Mental Status: She is alert and oriented to person, place, and time.     Assessment and Plan: Toniya was seen today for follow-up.  Diagnoses and all orders for this visit:  Class 1 obesity due to excess calories with serious comorbidity and body mass index (BMI) of 31.0 to 31.9 in adult -     WEGOVY  0.5 MG/0.5ML SOAJ; Inject 0.5 mg into the skin once a week.   Follow Up Instructions: See instructions above   I discussed the assessment and treatment plan with the patient. The patient was provided an opportunity to ask questions and all were answered. The patient agreed with the plan and demonstrated an understanding of the instructions.    The patient was advised to call back or seek an in-person evaluation if the symptoms worsen or if the condition fails to improve as anticipated.  Roselie Mood, NP

## 2023-09-09 NOTE — Patient Instructions (Signed)
 Maintain med dose, heart healthy diet and daily exercise.

## 2023-09-14 ENCOUNTER — Ambulatory Visit: Admitting: Nurse Practitioner

## 2023-09-29 ENCOUNTER — Ambulatory Visit: Admitting: Nurse Practitioner

## 2023-10-08 ENCOUNTER — Encounter: Payer: Self-pay | Admitting: Nurse Practitioner

## 2023-10-08 ENCOUNTER — Ambulatory Visit: Admitting: Nurse Practitioner

## 2023-10-08 VITALS — BP 104/62 | HR 85 | Temp 98.8°F | Ht 66.0 in | Wt 189.0 lb

## 2023-10-08 DIAGNOSIS — Z6830 Body mass index (BMI) 30.0-30.9, adult: Secondary | ICD-10-CM

## 2023-10-08 DIAGNOSIS — E66811 Obesity, class 1: Secondary | ICD-10-CM | POA: Diagnosis not present

## 2023-10-08 DIAGNOSIS — E6609 Other obesity due to excess calories: Secondary | ICD-10-CM

## 2023-10-08 MED ORDER — WEGOVY 1 MG/0.5ML ~~LOC~~ SOAJ: 1.0000 mg | SUBCUTANEOUS | 1 refills | Status: DC

## 2023-10-08 NOTE — Patient Instructions (Signed)
 Maintain Heart healthy diet and daily exercise. Maintain current medications.

## 2023-10-08 NOTE — Progress Notes (Signed)
                Established Patient Visit  Patient: Misty Wright   DOB: [birth year]   32 y.o. Female  MRN: [id number] Visit Date: 10/08/2023  Subjective:    Chief Complaint  Patient presents with   Follow-up    4 week follow for weight management    HPI Obesity Lost 7lbs in last 25month with wegovy  0.5mg  Total weight loss: 40lbs in 4months. Denies any adverse effects with wegovy  Has maintained high protein/high fiber diet, and daily exercise Wt Readings from Last 3 Encounters:  10/08/23 189 lb (85.7 kg)  09/09/23 196 lb (88.9 kg)  07/02/23 219 lb (99.3 kg)    Increase wegovy  dose to 1mg  weekly F/up in 2months   Reviewed medical, surgical, and social history today  Medications: Outpatient Medications Prior to Visit  Medication Sig   Probiotic Product (PROBIOTIC DAILY PO) Take 1 tablet by mouth daily.   VITAMIN D  PO Take 1 capsule by mouth daily.   [DISCONTINUED] WEGOVY  0.5 MG/0.5ML SOAJ Inject 0.5 mg into the skin once a week.   No facility-administered medications prior to visit.   Reviewed past medical and social history.   ROS per HPI above      Objective:  BP 104/62 (BP Location: Left Arm, Patient Position: Sitting, Cuff Size: Large)   Pulse 85   Temp 98.8 F (37.1 C) (Oral)   Ht 5' 6 (1.676 m)   Wt 189 lb (85.7 kg)   LMP 09/18/2023   SpO2 98%   BMI 30.51 kg/m      Physical Exam Vitals and nursing note reviewed.  Cardiovascular:     Rate and Rhythm: Normal rate.     Pulses: Normal pulses.  Pulmonary:     Effort: Pulmonary effort is normal.  Neurological:     Mental Status: She is alert and oriented to person, place, and time.     No results found for any visits on 10/08/23.    Assessment & Plan:    Problem List Items Addressed This Visit     Obesity - Primary   Lost 7lbs in last 25month with wegovy  0.5mg  Total weight loss: 40lbs in 4months. Denies any adverse effects with wegovy  Has maintained high protein/high fiber diet, and daily  exercise Wt Readings from Last 3 Encounters:  10/08/23 189 lb (85.7 kg)  09/09/23 196 lb (88.9 kg)  07/02/23 219 lb (99.3 kg)    Increase wegovy  dose to 1mg  weekly F/up in 2months      Relevant Medications   semaglutide -weight management (WEGOVY ) 1 MG/0.5ML SOAJ SQ injection   Return in about 2 months (around 12/08/2023) for Weight management, vit. D deficiency, hyperlipidemia (fasting).     Roselie Mood, NP

## 2023-10-08 NOTE — Assessment & Plan Note (Addendum)
 Lost 7lbs in last 49month with wegovy  0.5mg  Total weight loss: 40lbs in 4months. Denies any adverse effects with wegovy  Has maintained high protein/high fiber diet, and daily exercise Wt Readings from Last 3 Encounters:  10/08/23 189 lb (85.7 kg)  09/09/23 196 lb (88.9 kg)  07/02/23 219 lb (99.3 kg)    Increase wegovy  dose to 1mg  weekly F/up in 2months

## 2023-11-02 ENCOUNTER — Ambulatory Visit: Admitting: Nurse Practitioner

## 2023-11-09 DIAGNOSIS — F4323 Adjustment disorder with mixed anxiety and depressed mood: Secondary | ICD-10-CM | POA: Diagnosis not present

## 2023-11-23 DIAGNOSIS — F4323 Adjustment disorder with mixed anxiety and depressed mood: Secondary | ICD-10-CM | POA: Diagnosis not present

## 2023-12-01 ENCOUNTER — Encounter: Payer: Self-pay | Admitting: Nurse Practitioner

## 2023-12-07 ENCOUNTER — Ambulatory Visit: Payer: Self-pay | Admitting: Nurse Practitioner

## 2023-12-07 ENCOUNTER — Telehealth: Payer: Self-pay

## 2023-12-07 ENCOUNTER — Encounter: Payer: Self-pay | Admitting: Nurse Practitioner

## 2023-12-07 ENCOUNTER — Other Ambulatory Visit (HOSPITAL_COMMUNITY): Payer: Self-pay

## 2023-12-07 ENCOUNTER — Ambulatory Visit: Admitting: Nurse Practitioner

## 2023-12-07 VITALS — BP 116/64 | HR 85 | Ht 66.0 in | Wt 171.0 lb

## 2023-12-07 DIAGNOSIS — E559 Vitamin D deficiency, unspecified: Secondary | ICD-10-CM | POA: Diagnosis not present

## 2023-12-07 DIAGNOSIS — E66811 Obesity, class 1: Secondary | ICD-10-CM

## 2023-12-07 DIAGNOSIS — E6609 Other obesity due to excess calories: Secondary | ICD-10-CM

## 2023-12-07 DIAGNOSIS — Z6830 Body mass index (BMI) 30.0-30.9, adult: Secondary | ICD-10-CM | POA: Diagnosis not present

## 2023-12-07 DIAGNOSIS — E663 Overweight: Secondary | ICD-10-CM

## 2023-12-07 LAB — COMPREHENSIVE METABOLIC PANEL WITH GFR
ALT: 17 U/L (ref 0–35)
AST: 22 U/L (ref 0–37)
Albumin: 4.8 g/dL (ref 3.5–5.2)
Alkaline Phosphatase: 38 U/L — ABNORMAL LOW (ref 39–117)
BUN: 7 mg/dL (ref 6–23)
CO2: 27 meq/L (ref 19–32)
Calcium: 9.4 mg/dL (ref 8.4–10.5)
Chloride: 103 meq/L (ref 96–112)
Creatinine, Ser: 0.68 mg/dL (ref 0.40–1.20)
GFR: 113.13 mL/min (ref >60.00)
Glucose, Bld: 81 mg/dL (ref 70–99)
Potassium: 4.5 meq/L (ref 3.5–5.1)
Sodium: 138 meq/L (ref 135–145)
Total Bilirubin: 0.7 mg/dL (ref 0.2–1.2)
Total Protein: 7.2 g/dL (ref 6.0–8.3)

## 2023-12-07 LAB — VITAMIN D 25 HYDROXY (VIT D DEFICIENCY, FRACTURES): VITD: 39.01 ng/mL (ref 30.00–100.00)

## 2023-12-07 MED ORDER — WEGOVY 1 MG/0.5ML ~~LOC~~ SOAJ: 1.0000 mg | SUBCUTANEOUS | 0 refills | Status: AC

## 2023-12-07 NOTE — Patient Instructions (Addendum)
 Contrave or Qsymia or saxenda. Maintain med dose (ok to take every other week), heart healthy diet and daily exercise Go to lab

## 2023-12-07 NOTE — Telephone Encounter (Signed)
 Pharmacy Patient Advocate Encounter   Received notification from Onbase that prior authorization for Wegovy  1MG /0.5ML auto-injectors  is required/requested.   Insurance verification completed.   The patient is insured through [employer].   Per test claim: PA required; PA submitted to above mentioned insurance via Latent Key/confirmation #/EOC [id number] Status is pending

## 2023-12-07 NOTE — Progress Notes (Signed)
 Established Patient Visit  Patient: Misty Wright   DOB: 1990-10-16   33 y.o. Female  MRN: [id number] Visit Date: 12/07/2023  Subjective:    Chief Complaint  Patient presents with   Follow-up    2 month follow up for weight management  PAP scheduled for December    HPI Overweight (BMI 25.0-29.9) Lost 13lbs in last 2months with wegovy  1mg  Total weight loss: 53lbs in 6months. Denies any adverse effects with wegovy  Has maintained high protein/high fiber diet, and daily exercise (cardio and weight training) Wt Readings from Last 3 Encounters:  12/07/23 171 lb (77.6 kg)  10/08/23 189 lb (85.7 kg)  09/09/23 196 lb (88.9 kg)    There will be a change in insurance after December 2025. Advised to inquire if qsymia or contrave or saxemda is covered? Maintain wegovy  dose at this time Repeat vit D and CMP F/up in 3months   Reviewed medical, surgical, and social history today  Medications: Outpatient Medications Prior to Visit  Medication Sig   [DISCONTINUED] semaglutide -weight management (WEGOVY ) 1 MG/0.5ML SOAJ SQ injection Inject 1 mg into the skin once a week.   [DISCONTINUED] Probiotic Product (PROBIOTIC DAILY PO) Take 1 tablet by mouth daily.   [DISCONTINUED] VITAMIN D  PO Take 1 capsule by mouth daily.   No facility-administered medications prior to visit.   Reviewed past medical and social history.   ROS per HPI above      Objective:  BP 116/64 (BP Location: Left Arm, Patient Position: Sitting, Cuff Size: Large)   Pulse 85   Ht 5' 6 (1.676 m)   Wt 171 lb (77.6 kg)   SpO2 100%   BMI 27.60 kg/m      Physical Exam Vitals and nursing note reviewed.  Cardiovascular:     Rate and Rhythm: Normal rate.     Pulses: Normal pulses.  Pulmonary:     Effort: Pulmonary effort is normal.  Neurological:     Mental Status: She is alert and oriented to person, place, and time.  Psychiatric:        Mood and Affect: Mood normal.        Behavior:  Behavior normal.        Thought Content: Thought content normal.     No results found for any visits on 12/07/23.    Assessment & Plan:    Problem List Items Addressed This Visit     Overweight (BMI 25.0-29.9) - Primary   Lost 13lbs in last 2months with wegovy  1mg  Total weight loss: 53lbs in 6months. Denies any adverse effects with wegovy  Has maintained high protein/high fiber diet, and daily exercise (cardio and weight training) Wt Readings from Last 3 Encounters:  12/07/23 171 lb (77.6 kg)  10/08/23 189 lb (85.7 kg)  09/09/23 196 lb (88.9 kg)    There will be a change in insurance after December 2025. Advised to inquire if qsymia or contrave or saxemda is covered? Maintain wegovy  dose at this time Repeat vit D and CMP F/up in 3months      Relevant Medications   semaglutide -weight management (WEGOVY ) 1 MG/0.5ML SOAJ SQ injection   Other Visit Diagnoses       Vitamin D  deficiency       Relevant Orders   Comprehensive metabolic panel with GFR   VITAMIN D  25 Hydroxy (Vit-D Deficiency, Fractures)      Return in about 3 months (around 03/08/2024) for Weight management.  Roselie Mood, NP

## 2023-12-07 NOTE — Assessment & Plan Note (Addendum)
 Lost 13lbs in last 2months with wegovy  1mg  Total weight loss: 53lbs in 6months. Denies any adverse effects with wegovy  Has maintained high protein/high fiber diet, and daily exercise (cardio and weight training) Wt Readings from Last 3 Encounters:  12/07/23 171 lb (77.6 kg)  10/08/23 189 lb (85.7 kg)  09/09/23 196 lb (88.9 kg)    There will be a change in insurance after December 2025. Advised to inquire if qsymia or contrave or saxemda is covered? Maintain wegovy  dose at this time Repeat vit D and CMP F/up in 3months

## 2023-12-08 ENCOUNTER — Other Ambulatory Visit (HOSPITAL_COMMUNITY): Payer: Self-pay

## 2023-12-08 NOTE — Telephone Encounter (Signed)
 Pharmacy Patient Advocate Encounter  Received notification from [facility] that Prior Authorization for Wegovy  1MG /0.5ML auto-injectors  has been APPROVED from 12/07/23 to 12/06/24. Unable to obtain price due to refill too soon rejection, last fill date 12/07/23 next available fill date11/03/25   PA #/Case ID/Reference #: [id number]

## 2023-12-09 ENCOUNTER — Other Ambulatory Visit (HOSPITAL_COMMUNITY): Payer: Self-pay

## 2024-01-05 DIAGNOSIS — Z630 Problems in relationship with spouse or partner: Secondary | ICD-10-CM | POA: Diagnosis not present

## 2024-01-24 DIAGNOSIS — Z630 Problems in relationship with spouse or partner: Secondary | ICD-10-CM | POA: Diagnosis not present

## 2024-02-05 DIAGNOSIS — D1801 Hemangioma of skin and subcutaneous tissue: Secondary | ICD-10-CM | POA: Diagnosis not present

## 2024-02-05 DIAGNOSIS — L814 Other melanin hyperpigmentation: Secondary | ICD-10-CM | POA: Diagnosis not present

## 2024-02-07 DIAGNOSIS — Z630 Problems in relationship with spouse or partner: Secondary | ICD-10-CM | POA: Diagnosis not present

## 2024-02-07 DIAGNOSIS — F4323 Adjustment disorder with mixed anxiety and depressed mood: Secondary | ICD-10-CM | POA: Diagnosis not present

## 2024-02-15 DIAGNOSIS — Z113 Encounter for screening for infections with a predominantly sexual mode of transmission: Secondary | ICD-10-CM | POA: Diagnosis not present

## 2024-02-15 DIAGNOSIS — Z124 Encounter for screening for malignant neoplasm of cervix: Secondary | ICD-10-CM | POA: Diagnosis not present

## 2024-02-15 DIAGNOSIS — Z01419 Encounter for gynecological examination (general) (routine) without abnormal findings: Secondary | ICD-10-CM | POA: Diagnosis not present

## 2024-03-10 ENCOUNTER — Ambulatory Visit: Admitting: Nurse Practitioner

## 2024-05-13 ENCOUNTER — Ambulatory Visit: Admitting: Nurse Practitioner

## 2024-05-30 ENCOUNTER — Encounter: Admitting: Nurse Practitioner

## 2024-06-13 ENCOUNTER — Encounter: Admitting: Nurse Practitioner
